# Patient Record
Sex: Female | Born: 1980 | Race: White | Hispanic: No | Marital: Married | Smoking: Never smoker
Health system: Southern US, Community
[De-identification: ages and names within clinical notes are randomized; demographics above are authoritative.]

## PROBLEM LIST (undated history)

## (undated) DIAGNOSIS — R87619 Unspecified abnormal cytological findings in specimens from cervix uteri: Secondary | ICD-10-CM

## (undated) DIAGNOSIS — N920 Excessive and frequent menstruation with regular cycle: Principal | ICD-10-CM

## (undated) DIAGNOSIS — Z87898 Personal history of other specified conditions: Secondary | ICD-10-CM

## (undated) DIAGNOSIS — D649 Anemia, unspecified: Secondary | ICD-10-CM

## (undated) DIAGNOSIS — IMO0002 Reserved for concepts with insufficient information to code with codable children: Secondary | ICD-10-CM

## (undated) DIAGNOSIS — D219 Benign neoplasm of connective and other soft tissue, unspecified: Secondary | ICD-10-CM

## (undated) DIAGNOSIS — R87629 Unspecified abnormal cytological findings in specimens from vagina: Secondary | ICD-10-CM

## (undated) DIAGNOSIS — N83209 Unspecified ovarian cyst, unspecified side: Secondary | ICD-10-CM

## (undated) DIAGNOSIS — Z9289 Personal history of other medical treatment: Secondary | ICD-10-CM

## (undated) HISTORY — DX: Unspecified abnormal cytological findings in specimens from cervix uteri: R87.619

## (undated) HISTORY — DX: Reserved for concepts with insufficient information to code with codable children: IMO0002

## (undated) HISTORY — PX: COLPOSCOPY W/ BIOPSY / CURETTAGE: SUR283

## (undated) HISTORY — DX: Excessive and frequent menstruation with regular cycle: N92.0

## (undated) HISTORY — DX: Unspecified ovarian cyst, unspecified side: N83.209

## (undated) HISTORY — DX: Unspecified abnormal cytological findings in specimens from vagina: R87.629

## (undated) HISTORY — DX: Benign neoplasm of connective and other soft tissue, unspecified: D21.9

## (undated) HISTORY — PX: LEEP: SHX91

## (undated) HISTORY — PX: WISDOM TOOTH EXTRACTION: SHX21

## (undated) HISTORY — PX: MYOMECTOMY: SHX85

---

## 2012-07-06 ENCOUNTER — Encounter: Payer: Self-pay | Admitting: Obstetrics and Gynecology

## 2012-07-06 ENCOUNTER — Encounter (INDEPENDENT_AMBULATORY_CARE_PROVIDER_SITE_OTHER): Payer: BC Managed Care – PPO | Admitting: Obstetrics and Gynecology

## 2012-07-06 VITALS — BP 138/90 | Ht 67.0 in | Wt 195.2 lb

## 2012-07-07 NOTE — Progress Notes (Signed)
This encounter was created in error - please disregard.

## 2012-07-09 ENCOUNTER — Ambulatory Visit (INDEPENDENT_AMBULATORY_CARE_PROVIDER_SITE_OTHER): Payer: BC Managed Care – PPO | Admitting: Adult Health

## 2012-07-09 ENCOUNTER — Encounter: Payer: Self-pay | Admitting: Adult Health

## 2012-07-09 VITALS — BP 134/80 | Ht 67.0 in | Wt 195.0 lb

## 2012-07-09 DIAGNOSIS — N920 Excessive and frequent menstruation with regular cycle: Secondary | ICD-10-CM

## 2012-07-09 DIAGNOSIS — Z87898 Personal history of other specified conditions: Secondary | ICD-10-CM

## 2012-07-09 DIAGNOSIS — Z8742 Personal history of other diseases of the female genital tract: Secondary | ICD-10-CM

## 2012-07-09 MED ORDER — NORGESTIMATE-ETH ESTRADIOL 0.25-35 MG-MCG PO TABS: 1.0000 | ORAL_TABLET | Freq: Every day | ORAL | Status: DC

## 2012-07-09 NOTE — Patient Instructions (Addendum)
Start sprintec with next menses Follow up in 2 months Sign up for my chart

## 2012-07-09 NOTE — Progress Notes (Signed)
Subjective:     Patient ID: Courtney Andersen, female   DOB: Jul 18, 1980, 32 y.o.   MRN: [id number]  HPI Courtney Andersen is a 32 year old white female who takes [place]ild[place] 1/20 and is complaining of bleeding x 9 days  and having clots. Has long term boyfriend. She is going to the gym and will be decreasing her carbs.  Review of Systems Patient denies any headaches, blurred vision, shortness of breath, chest pain, abdominal pain, problems with bowel movements, urination, or intercourse.  Positives as in HPI   Reviewed past medical,surgical, social and family history. Reviewed medications and allergies.  Objective:   Physical Exam Blood pressure 134/80, height 5\' 7"  (1.702 m), weight 195 lb (88.451 kg), last menstrual period 06/17/2012. Skin warm and dry.Pelvic: external genitalia is normal in appearance, vagina: is normal in appearance, cervix: bulbous, sp leep, uterus: normal size, shape and contour, non tender, no masses felt, adnexa: no masses or tenderness noted.   Assessment:     Menorrhagia    Plan:      Start Sprintec with next menses Follow up in 2 months  Take BP at home to keep check in it

## 2012-07-21 ENCOUNTER — Ambulatory Visit: Payer: BC Managed Care – PPO | Admitting: Obstetrics & Gynecology

## 2012-09-08 ENCOUNTER — Ambulatory Visit: Payer: BC Managed Care – PPO | Admitting: Adult Health

## 2012-10-29 ENCOUNTER — Encounter: Payer: Self-pay | Admitting: Adult Health

## 2012-10-29 ENCOUNTER — Ambulatory Visit (INDEPENDENT_AMBULATORY_CARE_PROVIDER_SITE_OTHER): Payer: BC Managed Care – PPO | Admitting: Adult Health

## 2012-10-29 ENCOUNTER — Other Ambulatory Visit (HOSPITAL_COMMUNITY)
Admission: RE | Admit: 2012-10-29 | Discharge: 2012-10-29 | Disposition: A | Discharge status: Home / Self Care | Payer: BC Managed Care – PPO | Source: Ambulatory Visit | Attending: Adult Health | Admitting: Adult Health

## 2012-10-29 VITALS — BP 152/80 | HR 72 | Ht 67.0 in | Wt 195.0 lb

## 2012-10-29 DIAGNOSIS — Z87898 Personal history of other specified conditions: Secondary | ICD-10-CM

## 2012-10-29 DIAGNOSIS — Z01419 Encounter for gynecological examination (general) (routine) without abnormal findings: Secondary | ICD-10-CM | POA: Insufficient documentation

## 2012-10-29 DIAGNOSIS — Z1151 Encounter for screening for human papillomavirus (HPV): Secondary | ICD-10-CM | POA: Insufficient documentation

## 2012-10-29 DIAGNOSIS — Z309 Encounter for contraceptive management, unspecified: Secondary | ICD-10-CM

## 2012-10-29 NOTE — Progress Notes (Signed)
Patient ID: Courtney Andersen, female   DOB: [birth year], 32 y.o.   MRN: [id number] History of Present Illness: Courtney Andersen is a 32 year old white female in for pap and physical.Her periods are not as long but still having clots, golf ball size, no pain.   Current Medications, Allergies, Past Medical History, Past Surgical History, Family History and Social History were reviewed in [employer] record.     Review of Systems: Patient denies any headaches, blurred vision, shortness of breath, chest pain, abdominal pain, problems with bowel movements, urination, or intercourse. No joint pain or mood changes, positives in HPI    Physical Exam:BP 152/80  Pulse 72  Ht 5\' 7"  (1.702 m)  Wt 195 lb (88.451 kg)  BMI 30.53 kg/m2  LMP 10/08/2012 General:  Well developed, well nourished, no acute distress Skin:  Warm and dry Neck:  Midline trachea, normal thyroid Lungs; Clear to auscultation bilaterally Breast:  No dominant palpable mass, retraction, or nipple discharge Cardiovascular: Regular rate and rhythm Abdomen:  Soft, non tender, no hepatosplenomegaly Pelvic:  External genitalia is normal in appearance.  The vagina is normal in appearance. The cervix is nulliparous and sp leep.pap with HPV performed.  Uterus is felt to be normal size, shape, and contour.  No  adnexal masses or tenderness not. Extremities:  No swelling or varicosities noted Psych:  Alert and cooperative,seems happy   Impression: Yearly gyn exam History of abnormal pap Contraceptive management     Plan: Physical and pap in 1 year Continue sprintec Try advil before period to see if decreases clots, if not will get [place]

## 2012-10-29 NOTE — Patient Instructions (Addendum)
Physical in 1 year Try advil before period

## 2012-11-20 ENCOUNTER — Telehealth: Payer: Self-pay | Admitting: Adult Health

## 2012-11-20 DIAGNOSIS — N938 Other specified abnormal uterine and vaginal bleeding: Secondary | ICD-10-CM

## 2012-11-20 NOTE — Telephone Encounter (Signed)
No answer will all back

## 2012-11-20 NOTE — Telephone Encounter (Signed)
Pt continues to have heavy periods with clots after taking the advil prior to period. Pt states is there anything Victorino Dike recommends for her to try other than the advil

## 2012-11-23 ENCOUNTER — Telehealth: Payer: Self-pay | Admitting: Adult Health

## 2012-11-23 NOTE — Telephone Encounter (Signed)
Pt has u/s scheduled per Cyril Mourning, NP

## 2012-11-23 NOTE — Addendum Note (Signed)
Addended by: Cyril Mourning A on: 11/23/2012 11:37 AM   Modules accepted: Orders

## 2012-11-23 NOTE — Telephone Encounter (Signed)
Bleeding x 16 days on sprintec will get [place] and labs Friday and see Monday pm

## 2012-11-24 ENCOUNTER — Ambulatory Visit (INDEPENDENT_AMBULATORY_CARE_PROVIDER_SITE_OTHER): Payer: BC Managed Care – PPO | Admitting: Adult Health

## 2012-11-24 ENCOUNTER — Encounter: Payer: Self-pay | Admitting: Adult Health

## 2012-11-24 ENCOUNTER — Other Ambulatory Visit: Payer: Self-pay | Admitting: Adult Health

## 2012-11-24 ENCOUNTER — Ambulatory Visit (INDEPENDENT_AMBULATORY_CARE_PROVIDER_SITE_OTHER): Payer: BC Managed Care – PPO

## 2012-11-24 VITALS — BP 120/80 | Ht 67.0 in | Wt 194.0 lb

## 2012-11-24 DIAGNOSIS — N949 Unspecified condition associated with female genital organs and menstrual cycle: Secondary | ICD-10-CM

## 2012-11-24 DIAGNOSIS — N83209 Unspecified ovarian cyst, unspecified side: Secondary | ICD-10-CM

## 2012-11-24 DIAGNOSIS — D219 Benign neoplasm of connective and other soft tissue, unspecified: Secondary | ICD-10-CM

## 2012-11-24 DIAGNOSIS — N938 Other specified abnormal uterine and vaginal bleeding: Secondary | ICD-10-CM

## 2012-11-24 DIAGNOSIS — D259 Leiomyoma of uterus, unspecified: Secondary | ICD-10-CM

## 2012-11-24 DIAGNOSIS — N920 Excessive and frequent menstruation with regular cycle: Secondary | ICD-10-CM

## 2012-11-24 HISTORY — DX: Unspecified ovarian cyst, unspecified side: N83.209

## 2012-11-24 HISTORY — DX: Excessive and frequent menstruation with regular cycle: N92.0

## 2012-11-24 HISTORY — DX: Benign neoplasm of connective and other soft tissue, unspecified: D21.9

## 2012-11-24 LAB — COMPREHENSIVE METABOLIC PANEL
ALT: 12 U/L (ref 0–35)
Albumin: 3.4 g/dL — ABNORMAL LOW (ref 3.5–5.2)
CO2: 26 mEq/L (ref 19–32)
Calcium: 8.7 mg/dL (ref 8.4–10.5)
Chloride: 102 mEq/L (ref 96–112)
Glucose, Bld: 85 mg/dL (ref 70–99)
Sodium: 135 mEq/L (ref 135–145)
Total Protein: 5.7 g/dL — ABNORMAL LOW (ref 6.0–8.3)

## 2012-11-24 LAB — CBC
Hemoglobin: 10.1 g/dL — ABNORMAL LOW (ref 12.0–15.0)
Platelets: 287 10*3/uL (ref 150–400)
RBC: 3.34 MIL/uL — ABNORMAL LOW (ref 3.87–5.11)
WBC: 9.8 10*3/uL (ref 4.0–10.5)

## 2012-11-24 LAB — TSH: TSH: 1.001 u[IU]/mL (ref 0.350–4.500)

## 2012-11-24 MED ORDER — DESOGESTREL-ETHINYL ESTRADIOL 0.15-0.02/0.01 MG (21/5) PO TABS: 1.0000 | ORAL_TABLET | Freq: Every day | ORAL | Status: DC

## 2012-11-24 NOTE — Patient Instructions (Addendum)
Uterine Fibroid A uterine fibroid is a growth (tumor) that occurs in a woman's uterus. This type of tumor is not cancerous and does not spread out of the uterus. A woman can have one or many fibroids, and the fiboid(s) can become quite large. A fibroid can vary in size, weight, and where it grows in the uterus. Most fibroids do not require medical treatment, but some can cause pain or heavy bleeding during and between periods. CAUSES  A fibroid is the result of a single uterine cell that keeps growing (unregulated), which is different than most cells in the human body. Most cells have a control mechanism that keeps them from reproducing without control.  SYMPTOMS  Bleeding. Pelvic pain and pressure. Bladder problems due to the size of the fibroid. Infertility and miscarriages depending on the size and location of the fibroid. DIAGNOSIS  A diagnosis is made by physical exam. Your caregiver may feel the lumpy tumors during a pelvic exam. Important information regarding size, location, and number of tumors can be gained by having an ultrasound. It is rare that other tests, such as a CT scan or MRI, are needed. TREATMENT  Your caregiver may recommend watchful waiting. This involves getting the fibroid checked by your caregiver to see if the fibroids grow or shrink.  Hormonal treatment or an intrauterine device (IUD) may be prescribed.  Surgery may be needed to remove the fibroids (myomectomy) or the uterus (hysterectomy). This depends on your situation. When fibroids interfere with fertility and a woman wants to become pregnant, a caregiver may recommend having the fibroids removed.  HOME CARE INSTRUCTIONS  Home care depends on how you were treated. In general:  Keep all follow-up appointments with your caregiver.  Only take medicine as told by your caregiver. Do not take aspirin. It can cause bleeding.  If you have excessive periods and soak tampons or pads in a half hour or less, contact your  caregiver immediately. If your periods are troublesome but not so heavy, lie down with your feet raised slightly above your heart. Place cold packs on your lower abdomen.  If your periods are heavy, write down the number of pads or tampons you use per month. Bring this information to your caregiver.  Talk to your caregiver about taking iron pills.  Include green vegetables in your diet.  If you were prescribed a hormonal treatment, take the hormonal medicines as directed.  If you need surgery, ask your caregiver for information on your specific surgery.  SEEK IMMEDIATE MEDICAL CARE IF: You have pelvic pain or cramps not controlled with medicines.  You have a sudden increase in pelvic pain.  You have an increase of bleeding between and during periods.  You feel lightheaded or have fainting episodes.  MAKE SURE YOU: Understand these instructions. Will watch your condition. Will get help right away if you are not doing well or get worse. Document Released: 02/16/2000 Document Revised: 05/13/2011 Document Reviewed: 03/11/2011 [facility] Patient Information 2014 [place], [place]. Menorrhagia Dysfunctional uterine bleeding is different from a normal menstrual period. When periods are heavy or there is more bleeding than is usual for you, it is called menorrhagia. It may be caused by hormonal imbalance, or physical, metabolic, or other problems. Examination is necessary in order that your caregiver may treat treatable causes. If this is a continuing problem, a D&C may be needed. That means that the cervix (the opening of the uterus or womb) is dilated (stretched larger) and the lining of the uterus is  scraped out. The tissue scraped out is then examined under a microscope by a specialist (pathologist) to make sure there is nothing of concern that needs further or more extensive treatment. HOME CARE INSTRUCTIONS   If medications were prescribed, take exactly as directed. Do not change or switch  medications without consulting your caregiver.  Long term heavy bleeding may result in iron deficiency. Your caregiver may have prescribed iron pills. They help replace the iron your body lost from heavy bleeding. Take exactly as directed. Iron may cause constipation. If this becomes a problem, increase the bran, fruits, and roughage in your diet.  Do not take aspirin or medicines that contain aspirin one week before or during your menstrual period. Aspirin may make the bleeding worse.  If you need to change your sanitary pad or tampon more than once every 2 hours, stay in bed and rest as much as possible until the bleeding stops.  Eat well-balanced meals. Eat foods high in iron. Examples are leafy green vegetables, meat, liver, eggs, and whole grain breads and cereals. Do not try to lose weight until the abnormal bleeding has stopped and your blood iron level is back to normal. SEEK MEDICAL CARE IF:   You need to change your sanitary pad or tampon more than once an hour.  You develop nausea (feeling sick to your stomach) and vomiting, dizziness, or diarrhea while you are taking your medicine.  You have any problems that may be related to the medicine you are taking. SEEK IMMEDIATE MEDICAL CARE IF:   You have a fever.  You develop chills.  You develop severe bleeding or start to pass blood clots.  You feel dizzy or faint. MAKE SURE YOU:   Understand these instructions.  Will watch your condition.  Will get help right away if you are not doing well or get worse. Document Released: 02/18/2005 Document Revised: 05/13/2011 Document Reviewed: 10/09/2007 [facility] Patient Information 2014 [place], [place]. Stop sprintec and start [employer]  Sunday use condoms Follow up in 4 weeks

## 2012-11-24 NOTE — Progress Notes (Signed)
Subjective:     Patient ID: Courtney Andersen, female   DOB: August 23, 1980, 32 y.o.   MRN: [id number]  HPI Courtney Andersen is a 32 year old white female in complaining of bleeding for about 16 days on sprintec, having [place] to evaluate.  Review of Systems See hPI Reviewed past medical,surgical, social and family history. Reviewed medications and allergies.     Objective:   Physical Exam BP 120/80  Ht 5\' 7"  (1.702 m)  Wt 194 lb (87.998 kg)  BMI 30.38 kg/m2  LMP 11/06/2012   reviewed [place] with pt, has 2 fibroids the largest is 4 cm and has right ovarian cyst 1 complex and 1 simple, stop sprintec today and start karvia Sunday.discussed [place] with Dr Despina Hidden.  Assessment:     Menorrhagia Fibroids Right ovarian cyst     Plan:     Check CBC,CMP,TSH Stop sprintec today and start karvia Sunday use condoms Follow up in 4 weeks prn problems

## 2012-11-25 ENCOUNTER — Telehealth: Payer: Self-pay | Admitting: Adult Health

## 2012-11-25 NOTE — Telephone Encounter (Signed)
Pt aware of labs and need to take iron bid

## 2012-11-27 ENCOUNTER — Other Ambulatory Visit: Payer: BC Managed Care – PPO

## 2012-11-30 ENCOUNTER — Ambulatory Visit: Payer: BC Managed Care – PPO | Admitting: Adult Health

## 2012-12-22 ENCOUNTER — Ambulatory Visit: Payer: BC Managed Care – PPO | Admitting: Adult Health

## 2012-12-25 ENCOUNTER — Ambulatory Visit: Payer: BC Managed Care – PPO | Admitting: Adult Health

## 2013-01-07 ENCOUNTER — Other Ambulatory Visit: Payer: Self-pay

## 2013-11-01 ENCOUNTER — Ambulatory Visit (INDEPENDENT_AMBULATORY_CARE_PROVIDER_SITE_OTHER): Payer: PRIVATE HEALTH INSURANCE | Admitting: Women's Health

## 2013-11-01 ENCOUNTER — Encounter: Payer: Self-pay | Admitting: Women's Health

## 2013-11-01 VITALS — BP 136/82 | Ht 67.0 in | Wt 198.0 lb

## 2013-11-01 DIAGNOSIS — Z01419 Encounter for gynecological examination (general) (routine) without abnormal findings: Secondary | ICD-10-CM

## 2013-11-01 DIAGNOSIS — N920 Excessive and frequent menstruation with regular cycle: Secondary | ICD-10-CM

## 2013-11-01 LAB — POCT HEMOGLOBIN: Hemoglobin: 11.1 g/dL — AB (ref 12.2–16.2)

## 2013-11-01 MED ORDER — DESOGESTREL-ETHINYL ESTRADIOL 0.15-0.02/0.01 MG (21/5) PO TABS: 1.0000 | ORAL_TABLET | Freq: Every day | ORAL | Status: DC

## 2013-11-01 NOTE — Patient Instructions (Signed)
Menorrhagia Menorrhagia is the medical term for when your menstrual periods are heavy or last longer than usual. With menorrhagia, every period you have may cause enough blood loss and cramping that you are unable to maintain your usual activities. CAUSES  In some cases, the cause of heavy periods is unknown, but a number of conditions may cause menorrhagia. Common causes include:  A problem with the hormone-producing thyroid gland (hypothyroid).  Noncancerous growths in the uterus (polyps or fibroids).  An imbalance of the estrogen and progesterone hormones.  One of your ovaries not releasing an egg during one or more months.  Side effects of having an intrauterine device (IUD).  Side effects of some medicines, such as anti-inflammatory medicines or blood thinners.  A bleeding disorder that stops your blood from clotting normally. SIGNS AND SYMPTOMS  During a normal period, bleeding lasts between 4 and 8 days. Signs that your periods are too heavy include:  You routinely have to change your pad or tampon every 1 or 2 hours because it is completely soaked.  You pass blood clots larger than 1 inch (2.5 cm) in size.  You have bleeding for more than 7 days.  You need to use pads and tampons at the same time because of heavy bleeding.  You need to wake up to change your pads or tampons during the night.  You have symptoms of anemia, such as tiredness, fatigue, or shortness of breath. DIAGNOSIS  Your health care provider will perform a physical exam and ask you questions about your symptoms and menstrual history. Other tests may be ordered based on what the health care provider finds during the exam. These tests can include:  Blood tests. Blood tests are used to check if you are pregnant or have hormonal changes, a bleeding or thyroid disorder, low iron levels (anemia), or other problems.  Endometrial biopsy. Your health care provider takes a sample of tissue from the inside of your  uterus to be examined under a microscope.  Pelvic ultrasound. This test uses sound waves to make a picture of your uterus, ovaries, and vagina. The pictures can show if you have fibroids or other growths.  Hysteroscopy. For this test, your health care provider will use a small telescope to look inside your uterus. Based on the results of your initial tests, your health care provider may recommend further testing. TREATMENT  Treatment may not be needed. If it is needed, your health care provider may recommend treatment with one or more medicines first. If these do not reduce bleeding enough, a surgical treatment might be an option. The best treatment for you will depend on:   Whether you need to prevent pregnancy.  Your desire to have children in the future.  The cause and severity of your bleeding.  Your opinion and personal preference.  Medicines for menorrhagia may include:  Birth control methods that use hormones. These include the pill, skin patch, vaginal ring, shots that you get every 3 months, hormonal IUD, and implant. These treatments reduce bleeding during your menstrual period.  Medicines that thicken blood and slow bleeding.  Medicines that reduce swelling, such as ibuprofen.  Medicines that contain a synthetic hormone called progestin.   Medicines that make the ovaries stop working for a short time.  You may need surgical treatment for menorrhagia if the medicines are unsuccessful. Treatment options include:  Dilation and curettage (D&C). In this procedure, your health care provider opens (dilates) your cervix and then scrapes or suctions tissue from   the lining of your uterus to reduce menstrual bleeding.  Operative hysteroscopy. This procedure uses a tiny tube with a light (hysteroscope) to view your uterine cavity and can help in the surgical removal of a polyp that may be causing heavy periods.  Endometrial ablation. Through various techniques, your health care  provider permanently destroys the entire lining of your uterus (endometrium). After endometrial ablation, most women have little or no menstrual flow. Endometrial ablation reduces your ability to become pregnant.  Endometrial resection. This surgical procedure uses an electrosurgical wire loop to remove the lining of the uterus. This procedure also reduces your ability to become pregnant.  Hysterectomy. Surgical removal of the uterus and cervix is a permanent procedure that stops menstrual periods. Pregnancy is not possible after a hysterectomy. This procedure requires anesthesia and hospitalization. HOME CARE INSTRUCTIONS   Only take over-the-counter or prescription medicines as directed by your health care provider. Take prescribed medicines exactly as directed. Do not change or switch medicines without consulting your health care provider.  Take any prescribed iron pills exactly as directed by your health care provider. Long-term heavy bleeding may result in low iron levels. Iron pills help replace the iron your body lost from heavy bleeding. Iron may cause constipation. If this becomes a problem, increase the bran, fruits, and roughage in your diet.  Do not take aspirin or medicines that contain aspirin 1 week before or during your menstrual period. Aspirin may make the bleeding worse.  If you need to change your sanitary pad or tampon more than once every 2 hours, stay in bed and rest as much as possible until the bleeding stops.  Eat well-balanced meals. Eat foods high in iron. Examples are leafy green vegetables, meat, liver, eggs, and whole grain breads and cereals. Do not try to lose weight until the abnormal bleeding has stopped and your blood iron level is back to normal. SEEK MEDICAL CARE IF:   You soak through a pad or tampon every 1 or 2 hours, and this happens every time you have a period.  You need to use pads and tampons at the same time because you are bleeding so much.  You  need to change your pad or tampon during the night.  You have a period that lasts for more than 8 days.  You pass clots bigger than 1 inch wide.  You have irregular periods that happen more or less often than once a month.  You feel dizzy or faint.  You feel very weak or tired.  You feel short of breath or feel your heart is beating too fast when you exercise.  You have nausea and vomiting or diarrhea while you are taking your medicine.  You have any problems that may be related to the medicine you are taking. SEEK IMMEDIATE MEDICAL CARE IF:   You soak through 4 or more pads or tampons in 2 hours.  You have any bleeding while you are pregnant. MAKE SURE YOU:   Understand these instructions.  Will watch your condition.  Will get help right away if you are not doing well or get worse. Document Released: 02/18/2005 Document Revised: 02/23/2013 Document Reviewed: 08/09/2012 [facility] Patient Information 2015 [place], [place]. This information is not intended to replace advice given to you by your health care provider. Make sure you discuss any questions you have with your health care provider.  Uterine Fibroid A uterine fibroid is a growth (tumor) that occurs in your uterus. This type of tumor is not  cancerous and does not spread out of the uterus. You can have one or many fibroids. Fibroids can vary in size, weight, and where they grow in the uterus. Some can become quite large. Most fibroids do not require medical treatment, but some can cause pain or heavy bleeding during and between periods. CAUSES  A fibroid is the result of a single uterine cell that keeps growing (unregulated), which is different than most cells in the human body. Most cells have a control mechanism that keeps them from reproducing without control.  SIGNS AND SYMPTOMS   Bleeding.  Pelvic pain and pressure.  Bladder problems due to the size of the fibroid.  Infertility and miscarriages depending on the  size and location of the fibroid. DIAGNOSIS  Uterine fibroids are diagnosed through a physical exam. Your health care provider may feel the lumpy tumors during a pelvic exam. Ultrasonography may be done to get information regarding size, location, and number of tumors.  TREATMENT   Your health care provider may recommend watchful waiting. This involves getting the fibroid checked by your health care provider to see if it grows or shrinks.   Hormone treatment or an intrauterine device (IUD) may be prescribed.   Surgery may be needed to remove the fibroids (myomectomy) or the uterus (hysterectomy). This depends on your situation. When fibroids interfere with fertility and a woman wants to become pregnant, a health care provider may recommend having the fibroids removed.  [place] care depends on how you were treated. In general:   Keep all follow-up appointments with your health care provider.   Only take over-the-counter or prescription medicines as directed by your health care provider. If you were prescribed a hormone treatment, take the hormone medicines exactly as directed. Do not take aspirin. It can cause bleeding.   Talk to your health care provider about taking iron pills.  If your periods are troublesome but not so heavy, lie down with your feet raised slightly above your heart. Place cold packs on your lower abdomen.   If your periods are heavy, write down the number of pads or tampons you use per month. Bring this information to your health care provider.   Include green vegetables in your diet.  SEEK IMMEDIATE MEDICAL CARE IF:  You have pelvic pain or cramps not controlled with medicines.   You have a sudden increase in pelvic pain.   You have an increase in bleeding between and during periods.   You have excessive periods and soak tampons or pads in a half hour or less.  You feel lightheaded or have fainting episodes. Document Released:  02/16/2000 Document Revised: 12/09/2012 Document Reviewed: 09/17/2012 [facility] Patient Information 2015 [place], [place]. This information is not intended to replace advice given to you by your health care provider. Make sure you discuss any questions you have with your health care provider.

## 2013-11-01 NOTE — Addendum Note (Signed)
Addended by: Traci Sermon A on: 11/01/2013 10:08 AM   Modules accepted: Orders

## 2013-11-01 NOTE — Progress Notes (Signed)
Patient ID: Courtney Andersen, female   DOB: 05/26/1980, 33 y.o.   MRN: [id number] Subjective:   Courtney Andersen is a 33 y.o. G45 Caucasian female here for a routine well-woman exam.  Patient's last menstrual period was 10/20/2013.    Current complaints: heavy periods w/ large clots x 1 yr. Periods are regular on COCs, but last 1.5-2wks w/ many clots slightly smaller than her fist, wears pads- on heavy days has to change q 1hr. 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 States Hgb was 7 or 8 when checked 3 months ago- has not been taking Fe d/t it causing ha's and constipation.  Has known fibroids, last u/s 11/24/12: 2 fibroids, 4cm & 2.1cm and 2 Rt ovarian cysts.  Does desire pregnancy in future, maybe in a few years as she is starting grad school now.  PCP: Dr. Anastasio Champion       Does not desire labs, checked by PCP 3 months ago- all normal except high triglycerides- working on losing weight  Social History: Sexual: heterosexual Marital Status: dating Living situation: with boyfriend Occupation: community support w/ [employer] Tobacco/alcohol: no tobacco, q other wkend Illicit drugs: no history of illicit drug use  The following portions of the patient's history were reviewed and updated as appropriate: allergies, current medications, past family history, past medical history, past social history, past surgical history and problem list.  Past Medical History Past Medical History  Diagnosis Date  . Abnormal Pap smear   . Menorrhagia 11/24/2012  . Fibroids 11/24/2012  . Other and unspecified ovarian cyst 11/24/2012    Past Surgical History Past Surgical History  Procedure Laterality Date  . Colposcopy w/ biopsy / curettage    . Leep      Gynecologic History No obstetric history on file.  Patient's last menstrual period was 10/20/2013. Contraception: COCs Last Pap: 2014. Results were: normal w/ neg HPV Last mammogram: never. Results were: n/a Last TCS: never  Obstetric History OB  History  No data available    Current Medications Current Outpatient Prescriptions on File Prior to Visit  Medication Sig Dispense Refill  . desogestrel-ethinyl estradiol (KARIVA,AZURETTE,MIRCETTE) 0.15-0.02/0.01 MG (21/5) tablet Take 1 tablet by mouth daily.  1 Package  11   No current facility-administered medications on file prior to visit.    Review of Systems Patient denies any headaches, blurred vision, shortness of breath, chest pain, abdominal pain, problems with bowel movements, urination, or intercourse.  Objective:  BP 136/82  Ht 5\' 7"  (1.702 m)  Wt 198 lb (89.812 kg)  BMI 31.00 kg/m2  LMP 10/20/2013  Hgb fingerstick: 11 today  Physical Exam  General:  Well developed, well nourished, no acute distress. She is alert and oriented x3. Skin:  Warm and dry Neck:  Midline trachea, no thyromegaly or nodules Cardiovascular: Regular rate and rhythm, no murmur heard Lungs:  Effort normal, all lung fields clear to auscultation bilaterally Breasts:  No dominant palpable mass, retraction, or nipple discharge Abdomen:  Soft, non tender, no hepatosplenomegaly or masses Pelvic:  External genitalia is normal in appearance.  The vagina is normal in appearance. The cervix is bulbous, no CMT, multiple endocervical lobules- co-exam w/ JAG- from prior LEEP.  Thin prep pap is not doneUterus is felt to be enlarged.  No adnexal masses or tenderness noted. Extremities:  No swelling or varicosities noted Psych:  She has a normal mood and affect  Assessment:   Healthy well-woman exam H/O abnormal pap w/ LEEP 90yrs ago, last years pap  normal w/ neg HPV Menorrhagia Known fibroids and Rt ovarian cysts  Plan:  Refill COCs for now Iron rich diet F/U 1wk for pelvic u/s to assess fibroids/ovarian cysts, then f/u w/ me, or sooner if needed Mammogram @33yo  or sooner if problems Colonoscopy @33yo  or sooner if problems  Tawnya Crook CNM, WHNP-BC 11/01/2013 9:10 AM

## 2013-11-01 NOTE — Addendum Note (Signed)
Addended by: Traci Sermon A on: 11/01/2013 10:49 AM   Modules accepted: Orders

## 2013-11-02 ENCOUNTER — Other Ambulatory Visit: Payer: PRIVATE HEALTH INSURANCE

## 2013-11-02 ENCOUNTER — Ambulatory Visit: Payer: PRIVATE HEALTH INSURANCE | Admitting: Women's Health

## 2013-11-03 ENCOUNTER — Ambulatory Visit: Payer: PRIVATE HEALTH INSURANCE | Admitting: Women's Health

## 2013-11-09 ENCOUNTER — Ambulatory Visit (INDEPENDENT_AMBULATORY_CARE_PROVIDER_SITE_OTHER): Payer: PRIVATE HEALTH INSURANCE

## 2013-11-09 ENCOUNTER — Ambulatory Visit (INDEPENDENT_AMBULATORY_CARE_PROVIDER_SITE_OTHER): Payer: PRIVATE HEALTH INSURANCE | Admitting: Women's Health

## 2013-11-09 ENCOUNTER — Encounter: Payer: Self-pay | Admitting: Women's Health

## 2013-11-09 ENCOUNTER — Other Ambulatory Visit: Payer: PRIVATE HEALTH INSURANCE

## 2013-11-09 VITALS — BP 130/68 | Ht 67.0 in | Wt 202.0 lb

## 2013-11-09 DIAGNOSIS — N920 Excessive and frequent menstruation with regular cycle: Secondary | ICD-10-CM

## 2013-11-09 MED ORDER — NORGESTIMATE-ETH ESTRADIOL 0.25-35 MG-MCG PO TABS: 1.0000 | ORAL_TABLET | Freq: Every day | ORAL | Status: DC

## 2013-11-09 NOTE — Progress Notes (Signed)
Patient ID: Courtney Andersen, female   DOB: [birth year], 33 y.o.   MRN: [id number]   [facility] Visit  Patient name: Courtney Andersen MRN [id number]  Date of birth: 14-Jul-1980  CC & HPI:  [name] is a 33 y.o. G36 Caucasian female presenting today for f/u after pelvic u/s to assess fibroids, menorrhagia. Fibroids have only increased slightly from 4 & 2.1cm to 4.5 & 2.3cm, w/ small simple cyst on Lt ovary. Desires pregnancy w/in next 29yrs. Does not smoke, no h/o HTN, DVT/PE, CVA, MI, or migraines w/ aura.    Pertinent History Reviewed:  Medical & Surgical Hx:   Past Medical History  Diagnosis Date  . Abnormal Pap smear   . Menorrhagia 11/24/2012  . Fibroids 11/24/2012  . Other and unspecified ovarian cyst 11/24/2012   Past Surgical History  Procedure Laterality Date  . Colposcopy w/ biopsy / curettage    . Leep     Medications: Reviewed & Updated - see associated section Social History: Reviewed -  reports that she has been passively smoking.  She has never used smokeless tobacco.  Objective Findings:  Vitals: BP 130/68  Ht 5\' 7"  (1.702 m)  Wt 202 lb (91.627 kg)  BMI 31.63 kg/m2  LMP 10/20/2013  Physical Examination: General appearance - alert, well appearing, and in no distress  No results found for this or any previous visit (from the past 24 hour(s)).   Assessment & Plan:  A:   Menorrhagia  Stable uterine fibroids P:  Discussed w/ JAG, and then discussed options of increasing estrogen in COCs vs. Depo vs. Megace, pt opted for increasing estrogen in COCs  Stop Mircette, rx Sprintec w/ 11RF   F/U 67mths for f/u, call sooner if not helping  Tawnya Crook CNM, [facility] 11/09/2013 4:04 PM

## 2013-11-09 NOTE — Patient Instructions (Signed)

## 2013-11-17 ENCOUNTER — Encounter: Payer: Self-pay | Admitting: Women's Health

## 2014-02-08 ENCOUNTER — Ambulatory Visit: Payer: PRIVATE HEALTH INSURANCE | Admitting: Women's Health

## 2014-09-02 DIAGNOSIS — Z9289 Personal history of other medical treatment: Secondary | ICD-10-CM

## 2014-09-02 HISTORY — DX: Personal history of other medical treatment: Z92.89

## 2014-10-25 ENCOUNTER — Other Ambulatory Visit: Payer: Self-pay | Admitting: Women's Health

## 2014-11-03 ENCOUNTER — Other Ambulatory Visit (HOSPITAL_COMMUNITY)
Admission: RE | Admit: 2014-11-03 | Discharge: 2014-11-03 | Disposition: A | Discharge status: Home / Self Care | Payer: PRIVATE HEALTH INSURANCE | Source: Ambulatory Visit | Attending: Advanced Practice Midwife | Admitting: Advanced Practice Midwife

## 2014-11-03 ENCOUNTER — Encounter: Payer: Self-pay | Admitting: Advanced Practice Midwife

## 2014-11-03 ENCOUNTER — Ambulatory Visit (INDEPENDENT_AMBULATORY_CARE_PROVIDER_SITE_OTHER): Payer: PRIVATE HEALTH INSURANCE | Admitting: Advanced Practice Midwife

## 2014-11-03 VITALS — BP 130/80 | HR 80 | Ht 67.0 in | Wt 197.0 lb

## 2014-11-03 DIAGNOSIS — Z01419 Encounter for gynecological examination (general) (routine) without abnormal findings: Secondary | ICD-10-CM | POA: Insufficient documentation

## 2014-11-03 DIAGNOSIS — Z1151 Encounter for screening for human papillomavirus (HPV): Secondary | ICD-10-CM | POA: Insufficient documentation

## 2014-11-03 MED ORDER — NORGESTIMATE-ETH ESTRADIOL 0.25-35 MG-MCG PO TABS: 1.0000 | ORAL_TABLET | Freq: Every day | ORAL | Status: DC

## 2014-11-03 NOTE — Progress Notes (Signed)
Courtney Andersen 34 y.o.  Filed Vitals:   11/03/14 0834  BP: 130/80  Pulse: 80     Past Medical History: Past Medical History  Diagnosis Date  . Abnormal Pap smear   . Menorrhagia 11/24/2012  . Fibroids 11/24/2012  . Other and unspecified ovarian cyst 11/24/2012    Past Surgical History: Past Surgical History  Procedure Laterality Date  . Colposcopy w/ biopsy / curettage    . Leep      Family History: Family History  Problem Relation Age of Onset  . Hypertension Mother   . Diabetes Father   . Hypertension Father   . Diabetes Brother   . Cancer Paternal Aunt   . Cancer Paternal Grandmother     Social History: Social History  Substance Use Topics  . Smoking status: Passive Smoke Exposure - Never Smoker  . Smokeless tobacco: Never Used  . Alcohol Use: Yes     Comment: occ    Allergies: No Known Allergies    Current outpatient prescriptions:  Marland Kitchen  MONO-LINYAH 0.25-35 MG-MCG tablet, take 1 tablet by mouth once daily, Disp: 28 tablet, Rfl: 0  History of Present Illness: Here for well woman exam.  Had LEEP 2013, normal paps with neg HPV 2014.  Has known fibroids, last [place] 2015 (stable in size).  Last year estrogen in COC's was increased in an effort to help decrease flow. It didn't really work.  Bleeding may be less, but still has blood clots.  This last month period.was actually light and normal.   Gets labs checked with PCP--last check a year or so ago, triglycerides were a litlle high.Excercises (workout video) 4-5x/week.  Plans to try to get pregnant in the next year or so.  Same partner for several years.    Review of Systems   Patient denies any headaches, blurred vision, shortness of breath, chest pain, abdominal pain, problems with bowel movements, urination, or intercourse.   Physical Exam: General:  Well developed, well nourished, no acute distress Skin:  Warm and dry Neck:  Midline trachea, normal thyroid Lungs; Clear to auscultation bilaterally Breast:   No dominant palpable mass, retraction, or nipple discharge Cardiovascular: Regular rate and rhythm Abdomen:  Soft, non tender, no hepatosplenomegaly Pelvic:  External genitalia is normal in appearance.  The vagina is normal in appearance.  The cervix is bulbous.  Uterus is felt to be 6-8 week size.  No adnexal masses or tenderness noted.  Extremities:  No swelling or varicosities noted Psych:  No mood changes.     Impression: Normal GYN exam     Plan: If this pap is normal with negative HPV, may go to q 3 year paps/cotesting.   Start PNV 1 month before stopping COC's if tries to get pregnant

## 2014-11-03 NOTE — Addendum Note (Signed)
Addended by: Linton Rump on: 11/03/2014 09:15 AM   Modules accepted: Orders

## 2014-11-08 LAB — CYTOLOGY - PAP

## 2015-01-23 ENCOUNTER — Telehealth: Payer: Self-pay | Admitting: Advanced Practice Midwife

## 2015-01-23 NOTE — Telephone Encounter (Signed)
Pt c/o heavy vaginal bleeding x 5 days with clots, history of fibroids, c/o symptoms of dizziness and increased heart rate (112). Pt states bleeding has slowed down now and symptoms have improved but wants to know what she can do for future reference. Pt informed to take iron, eat foods increased in iron, call our office while having the heavy bleeding to be seen. Pt verbalized understanding.

## 2015-09-02 DIAGNOSIS — Z87898 Personal history of other specified conditions: Secondary | ICD-10-CM

## 2015-09-02 HISTORY — DX: Personal history of other specified conditions: Z87.898

## 2015-09-25 ENCOUNTER — Observation Stay (HOSPITAL_COMMUNITY)
Admission: EM | Admit: 2015-09-25 | Discharge: 2015-09-26 | Disposition: A | Discharge status: Home / Self Care | Payer: PRIVATE HEALTH INSURANCE | Attending: Obstetrics and Gynecology | Admitting: Obstetrics and Gynecology

## 2015-09-25 ENCOUNTER — Encounter (HOSPITAL_COMMUNITY): Payer: Self-pay | Admitting: Emergency Medicine

## 2015-09-25 ENCOUNTER — Observation Stay (HOSPITAL_COMMUNITY): Payer: PRIVATE HEALTH INSURANCE

## 2015-09-25 DIAGNOSIS — R42 Dizziness and giddiness: Secondary | ICD-10-CM | POA: Insufficient documentation

## 2015-09-25 DIAGNOSIS — R51 Headache: Secondary | ICD-10-CM | POA: Insufficient documentation

## 2015-09-25 DIAGNOSIS — N939 Abnormal uterine and vaginal bleeding, unspecified: Principal | ICD-10-CM

## 2015-09-25 DIAGNOSIS — D259 Leiomyoma of uterus, unspecified: Secondary | ICD-10-CM | POA: Diagnosis not present

## 2015-09-25 DIAGNOSIS — N920 Excessive and frequent menstruation with regular cycle: Secondary | ICD-10-CM | POA: Diagnosis present

## 2015-09-25 DIAGNOSIS — Z7722 Contact with and (suspected) exposure to environmental tobacco smoke (acute) (chronic): Secondary | ICD-10-CM | POA: Diagnosis not present

## 2015-09-25 DIAGNOSIS — D500 Iron deficiency anemia secondary to blood loss (chronic): Secondary | ICD-10-CM | POA: Diagnosis not present

## 2015-09-25 DIAGNOSIS — N938 Other specified abnormal uterine and vaginal bleeding: Secondary | ICD-10-CM | POA: Diagnosis not present

## 2015-09-25 DIAGNOSIS — D219 Benign neoplasm of connective and other soft tissue, unspecified: Secondary | ICD-10-CM | POA: Diagnosis present

## 2015-09-25 DIAGNOSIS — D649 Anemia, unspecified: Secondary | ICD-10-CM | POA: Diagnosis present

## 2015-09-25 HISTORY — DX: Anemia, unspecified: D64.9

## 2015-09-25 LAB — CBC WITH DIFFERENTIAL/PLATELET
BASOS ABS: 0 10*3/uL (ref 0.0–0.1)
BASOS PCT: 0 %
Eosinophils Absolute: 0 10*3/uL (ref 0.0–0.7)
Eosinophils Relative: 0 %
HEMATOCRIT: 13.8 % — AB (ref 36.0–46.0)
HEMOGLOBIN: 4.2 g/dL — AB (ref 12.0–15.0)
Lymphocytes Relative: 17 %
Lymphs Abs: 1.3 10*3/uL (ref 0.7–4.0)
MCH: 22.1 pg — ABNORMAL LOW (ref 26.0–34.0)
MCHC: 30.4 g/dL (ref 30.0–36.0)
MCV: 72.6 fL — ABNORMAL LOW (ref 78.0–100.0)
MONOS PCT: 3 %
Monocytes Absolute: 0.3 10*3/uL (ref 0.1–1.0)
NEUTROS ABS: 6 10*3/uL (ref 1.7–7.7)
NEUTROS PCT: 79 %
Platelets: 228 10*3/uL (ref 150–400)
RBC: 1.9 MIL/uL — AB (ref 3.87–5.11)
RDW: 18.7 % — ABNORMAL HIGH (ref 11.5–15.5)
WBC: 7.6 10*3/uL (ref 4.0–10.5)

## 2015-09-25 LAB — ABO/RH: ABO/RH(D): O NEG

## 2015-09-25 LAB — URINE MICROSCOPIC-ADD ON: WBC, UA: NONE SEEN WBC/hpf (ref 0–5)

## 2015-09-25 LAB — WET PREP, GENITAL
SPERM: NONE SEEN
TRICH WET PREP: NONE SEEN
WBC WET PREP: NONE SEEN
YEAST WET PREP: NONE SEEN

## 2015-09-25 LAB — BASIC METABOLIC PANEL
ANION GAP: 5 (ref 5–15)
BUN: 9 mg/dL (ref 6–20)
CHLORIDE: 108 mmol/L (ref 101–111)
CO2: 23 mmol/L (ref 22–32)
Calcium: 7.9 mg/dL — ABNORMAL LOW (ref 8.9–10.3)
Creatinine, Ser: 0.64 mg/dL (ref 0.44–1.00)
GFR calc non Af Amer: >60 mL/min (ref >60)
Glucose, Bld: 114 mg/dL — ABNORMAL HIGH (ref 65–99)
POTASSIUM: 3.6 mmol/L (ref 3.5–5.1)
Sodium: 136 mmol/L (ref 135–145)

## 2015-09-25 LAB — URINALYSIS, ROUTINE W REFLEX MICROSCOPIC
Bilirubin Urine: NEGATIVE
Glucose, UA: NEGATIVE mg/dL
KETONES UR: NEGATIVE mg/dL
LEUKOCYTES UA: NEGATIVE
NITRITE: NEGATIVE
PH: 6 (ref 5.0–8.0)
PROTEIN: NEGATIVE mg/dL
Specific Gravity, Urine: <1.005 — ABNORMAL LOW (ref 1.005–1.030)

## 2015-09-25 LAB — POC URINE PREG, ED: Preg Test, Ur: NEGATIVE

## 2015-09-25 LAB — PREPARE RBC (CROSSMATCH)

## 2015-09-25 MED ORDER — NORGESTIMATE-ETH ESTRADIOL 0.25-35 MG-MCG PO TABS: 1.0000 | ORAL_TABLET | Freq: Every day | ORAL | Status: DC

## 2015-09-25 MED ORDER — ACETAMINOPHEN 325 MG PO TABS
650.0000 mg | ORAL_TABLET | Freq: Once | ORAL | Status: AC
  Administered 2015-09-25: 650 mg via ORAL
  Filled 2015-09-25: qty 2

## 2015-09-25 MED ORDER — PRENATAL MULTIVITAMIN CH
1.0000 | ORAL_TABLET | Freq: Every day | ORAL | Status: DC
  Administered 2015-09-25: 1 via ORAL
  Filled 2015-09-25 (×3): qty 1

## 2015-09-25 MED ORDER — ONDANSETRON HCL 4 MG/2ML IJ SOLN
4.0000 mg | Freq: Once | INTRAMUSCULAR | Status: AC
  Administered 2015-09-25: 4 mg via INTRAVENOUS
  Filled 2015-09-25: qty 2

## 2015-09-25 MED ORDER — ACETAMINOPHEN 325 MG PO TABS
650.0000 mg | ORAL_TABLET | ORAL | Status: DC | PRN
  Administered 2015-09-25: 650 mg via ORAL
  Filled 2015-09-25: qty 2

## 2015-09-25 MED ORDER — SODIUM CHLORIDE 0.9 % IV SOLN
510.0000 mg | Freq: Once | INTRAVENOUS | Status: AC
  Administered 2015-09-25: 510 mg via INTRAVENOUS
  Filled 2015-09-25: qty 17

## 2015-09-25 MED ORDER — DOCUSATE SODIUM 100 MG PO CAPS
100.0000 mg | ORAL_CAPSULE | Freq: Two times a day (BID) | ORAL | Status: DC
  Administered 2015-09-25 (×2): 100 mg via ORAL
  Filled 2015-09-25 (×2): qty 1

## 2015-09-25 MED ORDER — SODIUM CHLORIDE 0.9 % IV SOLN
Freq: Once | INTRAVENOUS | Status: AC
  Administered 2015-09-25: 12:00:00 via INTRAVENOUS

## 2015-09-25 MED ORDER — SODIUM CHLORIDE 0.9 % IV BOLUS (SEPSIS): 1000.0000 mL | Freq: Once | INTRAVENOUS | Status: DC

## 2015-09-25 MED ORDER — MEGESTROL ACETATE 40 MG PO TABS
40.0000 mg | ORAL_TABLET | Freq: Three times a day (TID) | ORAL | Status: DC
  Administered 2015-09-25 (×2): 40 mg via ORAL
  Filled 2015-09-25 (×6): qty 1

## 2015-09-25 MED ORDER — SODIUM CHLORIDE 0.9 % IV SOLN
INTRAVENOUS | Status: DC
  Administered 2015-09-25: 14:00:00 via INTRAVENOUS

## 2015-09-25 NOTE — ED Notes (Signed)
Report given to Audrea Muscat on 300

## 2015-09-25 NOTE — ED Triage Notes (Signed)
Pt reports vaginal bleeding x3 weeks. Pt reports LMP June 24th. Pt reports intense bleeding began on tuhrsday. Pt reports has had to change pad 1 time within the last hour. Pt reports history of anemia and reports intermittent headaches since the  Vaginal bleeding started.

## 2015-09-25 NOTE — ED Notes (Signed)
Hgb-4.2. EDP and EDPA aware.

## 2015-09-25 NOTE — ED Provider Notes (Signed)
[facility] DEPT Provider Note   CSN: [id number] Arrival date & time: 09/25/15  0830  First Provider Contact:  First MD Initiated Contact with Patient 09/25/15 1015        History   Chief Complaint Chief Complaint  Patient presents with  . Vaginal Bleeding    HPI Courtney Andersen is a 35 y.o. female who complains of persistent vaginal bleeding for 3 weeks. She states that she is currently taking birth control pills but has continued to bleed. She reports heavier than usual periods with large blood clots.  She states that 2 days ago she "passed out" in the bathroom. She complains of generalized weakness, frontal headache, intermittent dizziness. She states that she is anemic and occasionally takes iron. She denies abdominal pain, pelvic pain, discharge, vomiting dysuria and fever.  HPI  Past Medical History:  Diagnosis Date  . Abnormal Pap smear   . Anemia   . Fibroids 11/24/2012  . Menorrhagia 11/24/2012  . Other and unspecified ovarian cyst 11/24/2012    Patient Active Problem List   Diagnosis Date Noted  . Menorrhagia 11/24/2012  . Fibroids 11/24/2012  . Other and unspecified ovarian cyst 11/24/2012  . History of abnormal Pap smear 07/09/2012    Past Surgical History:  Procedure Laterality Date  . COLPOSCOPY W/ BIOPSY / CURETTAGE    . LEEP      OB History    No data available       Home Medications    Prior to Admission medications   Medication Sig Start Date End Date Taking? Authorizing Provider  norgestimate-ethinyl estradiol (MONO-LINYAH) 0.25-35 MG-MCG tablet Take 1 tablet by mouth daily. 11/03/14  Yes Christin Fudge, CNM    Family History Family History  Problem Relation Age of Onset  . Hypertension Mother   . Diabetes Father   . Hypertension Father   . Diabetes Brother   . Cancer Paternal Grandmother   . Cancer Paternal Aunt     Social History Social History  Substance Use Topics  . Smoking status: Passive Smoke Exposure - Never  Smoker  . Smokeless tobacco: Never Used  . Alcohol use Yes     Comment: occ     Allergies   Review of patient's allergies indicates no known allergies.   Review of Systems Review of Systems  Constitutional: Negative for activity change, appetite change and fever.  Respiratory: Negative for chest tightness and shortness of breath.   Cardiovascular: Negative for chest pain.  Gastrointestinal: Negative for abdominal pain, nausea and vomiting.  Genitourinary: Positive for menstrual problem and vaginal bleeding. Negative for dysuria, pelvic pain and vaginal discharge.  Musculoskeletal: Negative for arthralgias and back pain.  Neurological: Positive for dizziness and headaches. Negative for weakness.     Physical Exam Updated Vital Signs BP 142/77 (BP Location: Right Arm)   Pulse 108   Temp 98.7 F (37.1 C) (Oral)   Resp 18   Ht 5\' 7"  (1.702 m)   Wt 80.7 kg   LMP 08/26/2015   SpO2 100%   BMI 27.88 kg/m   Physical Exam  Constitutional: She is oriented to person, place, and time. She appears well-developed and well-nourished. No distress.  HENT:  Head: Normocephalic and atraumatic.  Mouth/Throat: Oropharynx is clear and moist.  Cardiovascular: Normal rate and regular rhythm.   No murmur heard. Pulmonary/Chest: Effort normal and breath sounds normal. No respiratory distress.  Abdominal: Soft. Bowel sounds are normal. She exhibits no distension. There is no tenderness. There is no guarding.  Genitourinary: Uterus normal. Cervix exhibits no motion tenderness. Right adnexum displays no mass and no tenderness. Left adnexum displays no mass and no tenderness. There is bleeding in the vagina. No tenderness in the vagina.  Genitourinary Comments: Pelvic exam chaperoned by nursing staff. Small amount of blood in the vaginal vault. No cervical motion tenderness or adnexal masses or tenderness.  Musculoskeletal: Normal range of motion. She exhibits no edema.  Neurological: She is alert  and oriented to person, place, and time. She exhibits normal muscle tone. Coordination normal.  Skin: Skin is warm and dry.  Nursing note and vitals reviewed.    ED Treatments / Results  Labs (all labs ordered are listed, but only abnormal results are displayed) Labs Reviewed  WET PREP, GENITAL - Abnormal; Notable for the following:       Result Value   Clue Cells Wet Prep HPF POC PRESENT (*)    All other components within normal limits  URINALYSIS, ROUTINE W REFLEX MICROSCOPIC (NOT AT [facility]) - Abnormal; Notable for the following:    Specific Gravity, Urine <1.005 (*)    Hgb urine dipstick TRACE (*)    All other components within normal limits  CBC WITH DIFFERENTIAL/PLATELET - Abnormal; Notable for the following:    RBC 1.90 (*)    Hemoglobin 4.2 (*)    HCT 13.8 (*)    MCV 72.6 (*)    MCH 22.1 (*)    RDW 18.7 (*)    All other components within normal limits  BASIC METABOLIC PANEL - Abnormal; Notable for the following:    Glucose, Bld 114 (*)    Calcium 7.9 (*)    All other components within normal limits  URINE MICROSCOPIC-ADD ON - Abnormal; Notable for the following:    Squamous Epithelial / LPF 0-5 (*)    Bacteria, UA RARE (*)    All other components within normal limits  POC URINE PREG, ED  TYPE AND SCREEN  PREPARE RBC (CROSSMATCH)  GC/CHLAMYDIA PROBE AMP ([place]) NOT AT [facility]    EKG  EKG Interpretation None       Radiology No results found.  Procedures Procedures (including critical care time)  Medications Ordered in ED Medications  0.9 %  sodium chloride infusion (not administered)  sodium chloride 0.9 % bolus 1,000 mL (1,000 mLs Intravenous New Bag/Given 09/25/15 1105)  ondansetron (ZOFRAN) injection 4 mg (4 mg Intravenous Given 09/25/15 1105)  acetaminophen (TYLENOL) tablet 650 mg (650 mg Oral Given 09/25/15 1224)     Initial Impression / Assessment and Plan / ED Course  I have reviewed the triage vital signs and the nursing notes.  Pertinent  labs & imaging results that were available during my care of the patient were reviewed by me and considered in my medical decision making (see chart for details).  Clinical Course   Vital signs stable. Orthostatics obtained. Will order labs and administer IV fluids.  Hgb of 4.   1154  Consulted Dr. Glo Herring.  Will admit.    Final Clinical Impressions(s) / ED Diagnoses   Final diagnoses:  Abnormal uterine bleeding (AUB)    New Prescriptions New Prescriptions   No medications on file     Bufford Lope 09/25/15 [place], MD 09/25/15 1429

## 2015-09-26 DIAGNOSIS — N920 Excessive and frequent menstruation with regular cycle: Secondary | ICD-10-CM

## 2015-09-26 DIAGNOSIS — D500 Iron deficiency anemia secondary to blood loss (chronic): Secondary | ICD-10-CM | POA: Diagnosis not present

## 2015-09-26 DIAGNOSIS — N938 Other specified abnormal uterine and vaginal bleeding: Secondary | ICD-10-CM | POA: Diagnosis not present

## 2015-09-26 DIAGNOSIS — D259 Leiomyoma of uterus, unspecified: Secondary | ICD-10-CM | POA: Diagnosis not present

## 2015-09-26 DIAGNOSIS — N939 Abnormal uterine and vaginal bleeding, unspecified: Secondary | ICD-10-CM

## 2015-09-26 LAB — TYPE AND SCREEN
ABO/RH(D): O NEG
Antibody Screen: NEGATIVE
UNIT DIVISION: 0
Unit division: 0
Unit division: 0

## 2015-09-26 LAB — CBC
HEMATOCRIT: 23.2 % — AB (ref 36.0–46.0)
Hemoglobin: 7.4 g/dL — ABNORMAL LOW (ref 12.0–15.0)
MCH: 25.7 pg — ABNORMAL LOW (ref 26.0–34.0)
MCHC: 31.9 g/dL (ref 30.0–36.0)
MCV: 80.6 fL (ref 78.0–100.0)
PLATELETS: 200 10*3/uL (ref 150–400)
RBC: 2.88 MIL/uL — AB (ref 3.87–5.11)
RDW: 20 % — ABNORMAL HIGH (ref 11.5–15.5)
WBC: 7.1 10*3/uL (ref 4.0–10.5)

## 2015-09-26 LAB — GC/CHLAMYDIA PROBE AMP (~~LOC~~) NOT AT ARMC
CHLAMYDIA, DNA PROBE: NEGATIVE
Neisseria Gonorrhea: NEGATIVE

## 2015-09-26 MED ORDER — NORGESTIMATE-ETH ESTRADIOL 0.25-35 MG-MCG PO TABS: 1.0000 | ORAL_TABLET | Freq: Every day | ORAL | 11 refills | Status: DC

## 2015-09-26 MED ORDER — MEGESTROL ACETATE 40 MG PO TABS: 40.0000 mg | ORAL_TABLET | Freq: Three times a day (TID) | ORAL | 3 refills | Status: DC

## 2015-09-26 NOTE — Progress Notes (Signed)
Pt was presented discharge material along with prescriptions. Verbalized understanding and had no questions.

## 2015-09-26 NOTE — H&P (Signed)
Courtney Andersen MRN: [id number] DOB/AGE: Feb 15, 1981 35 y.o. Primary Care Physician:No PCP Per Patient Admit date: 09/25/2015 Chief Complaint: Heavy menstrual bleeding, severe anemia symptomatic HPI: This 35 year old female gravida 0 para 0 on no birth control, is admitted with severe anemia with hemoglobin of 4.2 after presenting to the emergency room with bleeding and fatigue lightheadedness related to the heavy menstrual bleeding. The bleeding actually began to reduce one day prior to arrival but the patient was symptomatic. She's been followed and has been offered birth control pills in the past. In the future she desires childbearing. She has a history of uterine fibroids. She is admitted for Megace therapy, transfusions and IV Feraheme Past Medical History:  Diagnosis Date  . Abnormal Pap smear   . Anemia   . Fibroids 11/24/2012  . Menorrhagia 11/24/2012  . Other and unspecified ovarian cyst 11/24/2012   Past Surgical History:  Procedure Laterality Date  . COLPOSCOPY W/ BIOPSY / CURETTAGE    . LEEP     Desires future pregnancy. 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     Family History  Problem Relation Age of Onset  . Hypertension Mother   . Diabetes Father   . Hypertension Father   . Diabetes Brother   . Cancer Paternal Grandmother   . Cancer Paternal Aunt    Has not been taking iron tablets due to constipation side effect Social History:  reports that she is a non-smoker but has been exposed to tobacco smoke. She has never used smokeless tobacco. She reports that she drinks alcohol. She reports that she does not use drugs.   Allergies: No Known Allergies  Medications Prior to Admission  Medication Sig Dispense Refill  . norgestimate-ethinyl estradiol (MONO-LINYAH) 0.25-35 MG-MCG tablet Take 1 tablet by mouth daily. 28 tablet 11       [id number] from the symptoms mentioned above,there are no  other symptoms referable to all systems reviewed.  Physical Exam: Blood pressure 112/62, pulse 84, temperature 98 F (36.7 C), temperature source Oral, resp. rate 18, height 5\' 7"  (1.702 m), weight 90.7 kg (200 lb), last menstrual period 08/26/2015, SpO2 100 %. Physical Examination: General appearance - alert, well appearing, and in no distress, oriented to person, place, and time and normal appearing weight Mental status - alert, oriented to person, place, and time, normal mood, behavior, speech, dress, motor activity, and thought processes Eyes - pupils equal and reactive, extraocular eye movements intact Abdomen - soft, nontender, nondistended, no masses or organomegaly Pelvic - examination not indicated, was examined in the emergency room and will be scheduled for follow-up as outpatient     Recent Labs  09/25/15 1044 09/26/15 0411  WBC 7.6 7.1  NEUTROABS 6.0  --   HGB 4.2* 7.4*  HCT 13.8* 23.2*  MCV 72.6* 80.6  PLT 228 200    Recent Labs  09/25/15 1044  NA 136  K 3.6  CL 108  CO2 23  GLUCOSE 114*  BUN 9  CREATININE 0.64  CALCIUM 7.9*  lablast2(ast:2,ALT:2,alkphos:2,bilitot:2,prot:2,albumin:2)@    Recent Results (from the past 240 hour(s))  Wet prep, genital     Status: Abnormal   Collection Time: 09/25/15 11:22 AM  Result Value Ref Range Status   Yeast Wet Prep HPF POC NONE SEEN NONE SEEN Final   Trich, Wet Prep NONE SEEN NONE SEEN Final   Clue Cells Wet Prep HPF POC PRESENT (A) NONE  SEEN Final   WBC, Wet Prep HPF POC NONE SEEN NONE SEEN Final   Sperm NONE SEEN  Final     US Transvaginal Non-ob  Result Date: 09/25/2015 CLINICAL DATA:  Three weeks of abnormal uterine bleeding, menorrhagia, anemia, history of fibroids on prior ultrasound EXAM: TRANSABDOMINAL AND TRANSVAGINAL ULTRASOUND OF PELVIS TECHNIQUE: Both transabdominal and transvaginal ultrasound examinations of the pelvis were performed. Transabdominal technique was performed for global imaging of the  pelvis including uterus, ovaries, adnexal regions, and pelvic cul-de-sac. It was necessary to proceed with endovaginal exam following the transabdominal exam to visualize the uterus, endometrium, ovaries, and adnexal regions. COMPARISON:  10.0 x 4.2 x 5.4 cm. FINDINGS: Uterus Measurements: 10.0 x 4.2 x 5.4 cm .In the right aspect of the uterine corpus there is a 4.5 x 3.1 x 4.9 cm hypoechoic focus with distal shadowing most compatible with a fibroid. A second lower corpus fibroid is suspected anteriorly measuring 2.3 x 2.1 x 2.3 cm. Endometrium Thickness: 5.1.  No focal abnormality visualized. Right ovary Measurements: 2.2 x 1.7 x 2.7 cm. Visualization is limited due to bowel gas. The echotexture is normal. Left ovary Measurements: 2.5 x 1.2 x 2.4 cm. Visualization is limited due to bowel gas. The echotexture is normal. Other findings None IMPRESSION: 1. Two uterine fibroids with the largest measuring 4.9 cm. Normal endometrial stripe thickening. If bleeding remains unresponsive to hormonal or medical therapy, sonohysterogram should be considered for focal lesion work-up. (Ref: Radiological Reasoning: Algorithmic Workup of Abnormal Vaginal Bleeding with Endovaginal Sonography and Sonohysterography. AJR 2008; [id number]) 2. Somewhat limited visualization of the ovaries. There are grossly normal in size and echotexture. 3. No free pelvic fluid. Electronically Signed   By: David  Martinique M.D.   On: 09/25/2015 14:15  US Pelvis Complete  Result Date: 09/25/2015 CLINICAL DATA:  Three weeks of abnormal uterine bleeding, menorrhagia, anemia, history of fibroids on prior ultrasound EXAM: TRANSABDOMINAL AND TRANSVAGINAL ULTRASOUND OF PELVIS TECHNIQUE: Both transabdominal and transvaginal ultrasound examinations of the pelvis were performed. Transabdominal technique was performed for global imaging of the pelvis including uterus, ovaries, adnexal regions, and pelvic cul-de-sac. It was necessary to proceed with endovaginal  exam following the transabdominal exam to visualize the uterus, endometrium, ovaries, and adnexal regions. COMPARISON:  10.0 x 4.2 x 5.4 cm. FINDINGS: Uterus Measurements: 10.0 x 4.2 x 5.4 cm .In the right aspect of the uterine corpus there is a 4.5 x 3.1 x 4.9 cm hypoechoic focus with distal shadowing most compatible with a fibroid. A second lower corpus fibroid is suspected anteriorly measuring 2.3 x 2.1 x 2.3 cm. Endometrium Thickness: 5.1.  No focal abnormality visualized. Right ovary Measurements: 2.2 x 1.7 x 2.7 cm. Visualization is limited due to bowel gas. The echotexture is normal. Left ovary Measurements: 2.5 x 1.2 x 2.4 cm. Visualization is limited due to bowel gas. The echotexture is normal. Other findings None IMPRESSION: 1. Two uterine fibroids with the largest measuring 4.9 cm. Normal endometrial stripe thickening. If bleeding remains unresponsive to hormonal or medical therapy, sonohysterogram should be considered for focal lesion work-up. (Ref: Radiological Reasoning: Algorithmic Workup of Abnormal Vaginal Bleeding with Endovaginal Sonography and Sonohysterography. AJR 2008; [id number]) 2. Somewhat limited visualization of the ovaries. There are grossly normal in size and echotexture. 3. No free pelvic fluid. Electronically Signed   By: David  Martinique M.D.   On: 09/25/2015 14:15  Impression: Menorrhagia with anemia, uterine fibroids, desire for future fertility Active Problems:   Anemia     Plan: The  patient is to receive transfusions overnight and will be discharged for outpatient care will be suppressed with oral contraceptives, have sonohysterogram scheduled in the office in the near future      Katie Faraone V   09/26/2015, 7:51 AM

## 2015-09-26 NOTE — Discharge Summary (Signed)
Physician Discharge Summary  Patient ID: Courtney Andersen MRN: [id number] DOB/AGE: 34-22-1982 34 y.o.  Admit date: 09/25/2015 Discharge date: 09/26/2015  Admission Diagnoses:Menorrhagia, abnormal uterine bleeding, severe symptomatic anemia, uterine fibroids  Discharge Diagnoses:  Active Problems:   Menorrhagia   Fibroids   Anemia   Abnormal uterine bleeding (AUB)   Discharged Condition: good  Hospital Course: This 35 year old female gravida 0 para 0 was admitted emergency room on 09/25/2015 after presenting with symptomatic anemia with hemoglobin 4.2 at the end of the very long and heavy menses with extensive clotting. The patient has a history of uterine fibroids has not been on any oral contraceptives recently, and had quit taking her iron for the chronic abnormal uterine bleeding. Last ultrasound for uterus was 2 years ago  Consults: gynecology  Significant Diagnostic Studies: labs:  CBC Latest Ref Rng & Units 09/26/2015 09/25/2015 11/01/2013  WBC 4.0 - 10.5 K/uL 7.1 7.6 -  Hemoglobin 12.0 - 15.0 g/dL 7.4(L) 4.2(LL) 11.1(A)  Hematocrit 36.0 - 46.0 % 23.2(L) 13.8(L) -  Platelets 150 - 400 K/uL 200 228 -     Treatments: Transfusion 3 units packed cells, Megace 40 mg 3 times a day, IV ferriheme 550 mg repeat pelvic ultrasound   Discharge Exam: Blood pressure 112/62, pulse 84, temperature 98 F (36.7 C), temperature source Oral, resp. rate 18, height 5\' 7"  (1.702 m), weight 90.7 kg (200 lb), last menstrual period 08/26/2015, SpO2 100 %. General appearance: alert, cooperative and no distress Head: Normocephalic, without obvious abnormality, atraumatic Resp: Unlabored breathing GI: soft, non-tender; bowel sounds normal; no masses,  no organomegaly  Disposition: Final discharge disposition not confirmed  discharge home     Medication List    ASK your doctor about these medications   norgestimate-ethinyl estradiol 0.25-35 MG-MCG tablet Commonly known as:  MONO-LINYAH Take 1  tablet by mouth daily.      Follow-up Information    [facility] .   Specialty:  Obstetrics and Gynecology Why:  Please see Dr Glo Herring or Dr Elonda Husky within 2 weeks for followup Contact information: [address] [place]          Signed: Jonnie Kind 09/26/2015, 8:01 AM

## 2015-10-09 ENCOUNTER — Ambulatory Visit (INDEPENDENT_AMBULATORY_CARE_PROVIDER_SITE_OTHER): Payer: PRIVATE HEALTH INSURANCE | Admitting: Obstetrics and Gynecology

## 2015-10-09 ENCOUNTER — Encounter: Payer: Self-pay | Admitting: Obstetrics and Gynecology

## 2015-10-09 VITALS — BP 120/80 | Ht 67.0 in | Wt 200.0 lb

## 2015-10-09 DIAGNOSIS — N939 Abnormal uterine and vaginal bleeding, unspecified: Secondary | ICD-10-CM

## 2015-10-09 DIAGNOSIS — D500 Iron deficiency anemia secondary to blood loss (chronic): Secondary | ICD-10-CM

## 2015-10-09 DIAGNOSIS — D251 Intramural leiomyoma of uterus: Secondary | ICD-10-CM | POA: Diagnosis not present

## 2015-10-09 LAB — POCT HEMOGLOBIN: HEMOGLOBIN: 10.6 g/dL — AB (ref 12.2–16.2)

## 2015-10-09 NOTE — Progress Notes (Signed)
Patient ID: Courtney Andersen, female   DOB: 1980/04/03, 35 y.o.   MRN: [id number]    [place] [facility] Visit  @DATE @            Patient name: Courtney Andersen MRN [id number]  Date of birth: [birth year]  CC & HPI:  Courtney Andersen is a 35 y.o. female presenting today for f/u for further treatment of uterine fibroids. Pt was admitted to the hospital on 09/25/15 for menorrhagia, AUB, severe symptomatic anemia (HGB 4.2) and uterine fibroids. During her stay, she was treated with Transfusion 3 units packed cells, Megace 40 mg 3 times a day, IV ferriheme 550 mg repeat pelvic ultrasound. Pelvic US showed two uterine fibroids, with the largest measuring 4.9 cm. Pt notes she has been feeling well since discharge from the hospital.   Pt states she is currently taking continuous birth control. She reports she would like to have more children.   ROS:  Review of Systems  Constitutional:       No complaints; discussion only; feeling well since d/c from hospital   Pertinent History Reviewed:   Reviewed: Significant for fibroids Medical         Past Medical History:  Diagnosis Date  . Abnormal Pap smear   . Anemia   . Fibroids 11/24/2012  . Menorrhagia 11/24/2012  . Other and unspecified ovarian cyst 11/24/2012                              Surgical Hx:    Past Surgical History:  Procedure Laterality Date  . COLPOSCOPY W/ BIOPSY / CURETTAGE    . LEEP     Medications: Reviewed & Updated - see associated section                       Current Outpatient Prescriptions:  .  norgestimate-ethinyl estradiol (MONO-LINYAH) 0.25-35 MG-MCG tablet, Take 1 tablet by mouth daily., Disp: 1 Package, Rfl: 11 .  megestrol (MEGACE) 40 MG tablet, Take 1 tablet (40 mg total) by mouth 3 (three) times daily. Until bleeding stops completely, then d/c (Patient not taking: Reported on 10/09/2015), Disp: 45 tablet, Rfl: 3   Social History: Reviewed -  reports that she is a non-smoker but has been exposed to tobacco smoke. She has  never used smokeless tobacco.  Objective Findings:  Vitals: Blood pressure 120/80, height 5\' 7"  (1.702 m), weight 200 lb (90.7 kg), last menstrual period 08/26/2015.  Physical Examination: Discussion only  Discussed with pt further treatment of fibroids, including surgical options. At end of discussion, pt had opportunity to ask questions and has no further questions at this time.   Greater than 50% was spent in counseling and coordination of care with the patient. Total time greater than: 15 minutes    Assessment & Plan:   A:  1. Uterine fibroids, currently taking continuous oral contraceptives  2. Recent hospital admission for menorrhagia, AUB, severe symptomatic anemia (HGB 4.2) and uterine fibroids 3. Seeking conception in the future  4. HGB fingerstick in office today - 10.6  P:  1. Continue taking continuous oral contraceptives 2. F/u in 1 month if needed  3. Further communication via [place]  4. Will provide referral for consideration of myomectomy     By signing my name below, I, Courtney Andersen, attest that this documentation has been prepared under the direction and in the presence of Courtney Kind, MD. Electronically Signed: Harmon Andersen  Rodena Piety, ED Scribe. 10/09/15. 2:05 PM.  I personally performed the services described in this documentation, which was SCRIBED in my presence. The recorded information has been reviewed and considered accurate. It has been edited as necessary during review. Courtney Kind, MD

## 2015-10-17 ENCOUNTER — Telehealth: Payer: Self-pay | Admitting: Obstetrics and Gynecology

## 2015-10-24 NOTE — Telephone Encounter (Signed)
The patient was to follow up in a month for sonohysterogram, to consider whether to refer or do the surgery here. If she wishes to be seen by specialist before that, she may need to see Dr Neldon Labella., [phone] in [place] at [facility], or Dr

## 2015-10-30 ENCOUNTER — Telehealth: Payer: Self-pay | Admitting: Obstetrics and Gynecology

## 2015-10-30 NOTE — Telephone Encounter (Signed)
Pt states taking the Mono-Linyah birth control and still having vaginal bleeding did not know if Dr. Glo Herring wanted pt to take the Megace or not. Pt states Dr.Ferguson was going to refer to another physician due to possible surgery (Myomectomy).   Please advise.

## 2015-10-31 NOTE — Telephone Encounter (Signed)
I think this message was misdirected, I will redirect to Dr Glo Herring

## 2015-11-02 ENCOUNTER — Other Ambulatory Visit (INDEPENDENT_AMBULATORY_CARE_PROVIDER_SITE_OTHER): Payer: PRIVATE HEALTH INSURANCE | Admitting: *Deleted

## 2015-11-02 ENCOUNTER — Encounter: Payer: Self-pay | Admitting: *Deleted

## 2015-11-02 DIAGNOSIS — D649 Anemia, unspecified: Secondary | ICD-10-CM

## 2015-11-02 LAB — POCT HEMOGLOBIN: Hemoglobin: 13.9 g/dL (ref 12.2–16.2)

## 2015-11-02 NOTE — Progress Notes (Signed)
Pt here for hemoglobin check. Hemoglobin was great at 13.9!! Pt to return as scheduled or sooner if need be. [place]

## 2015-11-08 ENCOUNTER — Encounter: Payer: Self-pay | Admitting: Obstetrics and Gynecology

## 2015-11-08 ENCOUNTER — Ambulatory Visit (INDEPENDENT_AMBULATORY_CARE_PROVIDER_SITE_OTHER): Payer: PRIVATE HEALTH INSURANCE | Admitting: Obstetrics and Gynecology

## 2015-11-08 VITALS — BP 144/70 | HR 66 | Ht 67.0 in | Wt 198.8 lb

## 2015-11-08 DIAGNOSIS — D250 Submucous leiomyoma of uterus: Secondary | ICD-10-CM

## 2015-11-08 DIAGNOSIS — R030 Elevated blood-pressure reading, without diagnosis of hypertension: Secondary | ICD-10-CM | POA: Diagnosis not present

## 2015-11-08 DIAGNOSIS — D500 Iron deficiency anemia secondary to blood loss (chronic): Secondary | ICD-10-CM

## 2015-11-08 NOTE — Progress Notes (Signed)
Patient ID: Courtney Andersen, female   DOB: 1980/09/03, 35 y.o.   MRN: [id number]    [facility] Visit  @DATE @            Patient name: Courtney Andersen MRN [id number]  Date of birth: [birth year]  CC & HPI:   Chief Complaint  Patient presents with  . discuss surgery    Myomectomy     Courtney Andersen is a 35 y.o. female presenting today for discussion of possible Myomectomy. Pt states she has not had a period since admission to the hospital in 09/2015 d/t taking Megace, but notes that the bleeding restarts if she stops taking Megace. Her last fingerstick Hgb in office on 11/02/15 was 13.9.   ROS:  Review of Systems  Constitutional:       No complaints; discussion only    Pertinent History Reviewed:   Reviewed: Significant for fibroids Medical         Past Medical History:  Diagnosis Date  . Abnormal Pap smear   . Anemia   . Fibroids 11/24/2012  . Menorrhagia 11/24/2012  . Other and unspecified ovarian cyst 11/24/2012                              Surgical Hx:    Past Surgical History:  Procedure Laterality Date  . COLPOSCOPY W/ BIOPSY / CURETTAGE    . LEEP     Medications: Reviewed & Updated - see associated section                       Current Outpatient Prescriptions:  .  megestrol (MEGACE) 40 MG tablet, Take 1 tablet (40 mg total) by mouth 3 (three) times daily. Until bleeding stops completely, then d/c, Disp: 45 tablet, Rfl: 3 .  norgestimate-ethinyl estradiol (MONO-LINYAH) 0.25-35 MG-MCG tablet, Take 1 tablet by mouth daily., Disp: 1 Package, Rfl: 11   Social History: Reviewed -  reports that she is a non-smoker but has been exposed to tobacco smoke. She has never used smokeless tobacco.  Objective Findings:  Vitals: Blood pressure (!) 144/70, pulse 66, height 5\' 7"  (1.702 m), weight 198 lb 12.8 oz (90.2 kg).  Physical Examination: Discussion only  Discussed with pt risks and benefits of laparoscopic vs hysteroscopic myomectomy. At end of discussion, pt had  opportunity to ask questions and has no further questions at this time.   Greater than 50% was spent in counseling and coordination of care with the patient. Total time greater than: 15 minutes    Assessment & Plan:   A:  1. Encounter for discussion of Myomectomy   P:  1. Will schedule sonohysterogram 11/30/15 prior to determining laparoscopic vs hysteroscopic myomectomy   2 continue Megace for now.   By signing my name below, I, Hansel Feinstein, attest that this documentation has been prepared under the direction and in the presence of Jonnie Kind, MD. Electronically Signed: Hansel Feinstein, ED Scribe. 11/08/15. 9:25 AM.  I personally performed the services described in this documentation, which was SCRIBED in my presence. The recorded information has been reviewed and considered accurate. It has been edited as necessary during review. Jonnie Kind, MD

## 2015-11-29 ENCOUNTER — Other Ambulatory Visit: Payer: Self-pay | Admitting: Obstetrics and Gynecology

## 2015-11-29 DIAGNOSIS — N921 Excessive and frequent menstruation with irregular cycle: Secondary | ICD-10-CM

## 2015-11-30 ENCOUNTER — Other Ambulatory Visit: Payer: Self-pay | Admitting: Obstetrics and Gynecology

## 2015-11-30 ENCOUNTER — Other Ambulatory Visit: Payer: PRIVATE HEALTH INSURANCE

## 2015-11-30 ENCOUNTER — Ambulatory Visit (INDEPENDENT_AMBULATORY_CARE_PROVIDER_SITE_OTHER): Payer: PRIVATE HEALTH INSURANCE

## 2015-11-30 ENCOUNTER — Ambulatory Visit (INDEPENDENT_AMBULATORY_CARE_PROVIDER_SITE_OTHER): Payer: PRIVATE HEALTH INSURANCE | Admitting: Obstetrics and Gynecology

## 2015-11-30 DIAGNOSIS — D259 Leiomyoma of uterus, unspecified: Secondary | ICD-10-CM | POA: Diagnosis not present

## 2015-11-30 DIAGNOSIS — N921 Excessive and frequent menstruation with irregular cycle: Secondary | ICD-10-CM

## 2015-11-30 DIAGNOSIS — N854 Malposition of uterus: Secondary | ICD-10-CM

## 2015-11-30 DIAGNOSIS — D250 Submucous leiomyoma of uterus: Secondary | ICD-10-CM

## 2015-11-30 DIAGNOSIS — R938 Abnormal findings on diagnostic imaging of other specified body structures: Secondary | ICD-10-CM | POA: Diagnosis not present

## 2015-11-30 DIAGNOSIS — D252 Subserosal leiomyoma of uterus: Principal | ICD-10-CM

## 2015-11-30 NOTE — Progress Notes (Signed)
[place] SONOHYSTEROGRAM W/TV: Heterogeneous anteverted uterus w/mult.fibroids,(#1) posterior mid/lus subserosal fibroid 8.3 x 5.4 x 6.3 cm,(#2) hypervascular submucosal fibroid 3 x 2.9 x 1.9 cm,thickened endometrium 21 mm,normal ov's bilat,no free fluid,ov's appear mobile, Dr.Ferguson preformed the sonohysterogram,saline infusion was monitored by [place] throughout procedure.The saline outlined the submucosal fibroid.

## 2015-12-05 NOTE — Progress Notes (Addendum)
[facility] Visit  @DATE @            Patient name: Courtney Andersen MRN [id number]  Date of birth: 06/03/1980  CC & HPI:  Courtney Andersen is a 35 y.o. female presenting today for Follow-up from sonohysterogram performed today by myself in our office. She is a 35 year old gravida 0 para 0 desiring future fertility he is recently had severe anemia in response to profuse menorrhagia with hemoglobin dropped from 12-4.3 requiring transfusions as well as as intravenous Feraheme. She is currently on Megace to suppress her endometrium. Sonohysterogram performed 11/30/2015 by me, day of this visit, shows a submucous myoma that appears to protrude into the endometrial cavity and may have a smal stalk. it'll require resection. And would likely be resectable by use of Myosure or other hysteroscopic resection device. Since she'll require fertility workup thought that it would be appropriate for her to be discussed with Dr. Kerin Perna.. Left message for Dr. Kerin Perna 12/05/2015 for his call after reviewing the record  ROS:  ROS known fibroids since at least 2014 on ultrasound today it appears that there is a posterior subserous myoma as well Pertinent History Reviewed:   Reviewed: Significant for Colposcopy, LEEP date unknown. She had a normal Pap smear in 2014, and 2016 with negative HPV Medical         Past Medical History:  Diagnosis Date  . Abnormal Pap smear   . Anemia   . Fibroids 11/24/2012  . Menorrhagia 11/24/2012  . Other and unspecified ovarian cyst 11/24/2012                              Surgical Hx:    Past Surgical History:  Procedure Laterality Date  . COLPOSCOPY W/ BIOPSY / CURETTAGE    . LEEP     Medications: Reviewed & Updated - see associated section                       Current Outpatient Prescriptions:  .  megestrol (MEGACE) 40 MG tablet, Take 1 tablet (40 mg total) by mouth 3 (three) times daily. Until bleeding stops completely, then d/c, Disp: 45 tablet, Rfl: 3 .   norgestimate-ethinyl estradiol (MONO-LINYAH) 0.25-35 MG-MCG tablet, Take 1 tablet by mouth daily., Disp: 1 Package, Rfl: 11   Social History: Reviewed -  reports that she is a non-smoker but has been exposed to tobacco smoke. She has never used smokeless tobacco.  Objective Findings:  Vitals: Last menstrual period 09/22/2015.  Physical Examination: General appearance - alert, well appearing, and in no distress and normal appearing weight Mental status - alert, oriented to person, place, and time, normal mood, behavior, speech, dress, motor activity, and thought processes Abdomen - soft, nontender, nondistended, no masses or organomegaly Pelvic - normal external genitalia, vulva, vagina, cervix, uterus and adnexa, VULVA: normal appearing vulva with no masses, tenderness or lesions, VAGINA: normal appearing vagina with normal color and discharge, no lesions, CERVIX: normal appearing cervix without discharge or lesions, UTERUS: anteverted, enlarged to 6 week's size, please see the sonohysterogram report  ADNEXA: normal adnexa in size, nontender and no masses   Assessment & Plan:   A:  1. Submucous myoma, history of intramural and submucous myomas 2. History of anemia, currently stable on Megace 3. Desire for preservation of fertility  P:  1. Plan will discuss with Dr. Kerin Perna, and decide whether he is to resect the myoma  as a part of an overall fertility workup. She might be a good candidate for a concurrent laparoscopy is a part of her fertility workup.  2. Messages been left with Dr. Kerin Perna to discuss her case after review of sonohysterogram

## 2015-12-05 NOTE — Progress Notes (Signed)
The following noticing, incomplete note that we are attempting to close. Please ignore this documentation. The visit is documentedelsewhere for the care received 11/30/2015  [facility] Visit  @DATE @            Patient name: Courtney Andersen MRN [id number]  Date of birth: 1980-08-23  CC & HPI:   ROS:  ROS   Pertinent History Reviewed:   Reviewed: Significant for  Medical         Past Medical History:  Diagnosis Date  . Abnormal Pap smear   . Anemia   . Fibroids 11/24/2012  . Menorrhagia 11/24/2012  . Other and unspecified ovarian cyst 11/24/2012                              Surgical Hx:    Past Surgical History:  Procedure Laterality Date  . COLPOSCOPY W/ BIOPSY / CURETTAGE    . LEEP     Medications: Reviewed & Updated - see associated section                       Current Outpatient Prescriptions:  .  megestrol (MEGACE) 40 MG tablet, Take 1 tablet (40 mg total) by mouth 3 (three) times daily. Until bleeding stops completely, then d/c, Disp: 45 tablet, Rfl: 3 .  norgestimate-ethinyl estradiol (MONO-LINYAH) 0.25-35 MG-MCG tablet, Take 1 tablet by mouth daily., Disp: 1 Package, Rfl: 11   Social History: Reviewed -  reports that she is a non-smoker but has been exposed to tobacco smoke. She has never used smokeless tobacco.  Objective Findings:  Vitals: Last menstrual period 09/22/2015.  Physical Examination:    Assessment & Plan:   A:  1.   P:  1.  Please ignore this note

## 2015-12-28 ENCOUNTER — Ambulatory Visit (INDEPENDENT_AMBULATORY_CARE_PROVIDER_SITE_OTHER): Payer: PRIVATE HEALTH INSURANCE | Admitting: Obstetrics and Gynecology

## 2015-12-28 ENCOUNTER — Encounter: Payer: Self-pay | Admitting: Obstetrics and Gynecology

## 2015-12-28 VITALS — BP 130/78 | HR 62 | Wt 199.8 lb

## 2015-12-28 DIAGNOSIS — N939 Abnormal uterine and vaginal bleeding, unspecified: Secondary | ICD-10-CM

## 2015-12-28 DIAGNOSIS — D259 Leiomyoma of uterus, unspecified: Secondary | ICD-10-CM | POA: Diagnosis not present

## 2015-12-28 NOTE — Progress Notes (Signed)
°   [facility] Visit  12/28/15            Patient name: Courtney Andersen MRN [id number]  Date of birth: [birth year]  CC & HPI:  Courtney Andersen is a 35 y.o. female presenting today for follow up regarding her fertility work up.   ROS:  ROS No complaints at this time.   Pertinent History Reviewed:   Reviewed: Significant for fibroids, menorrhagia, LEEP Medical         Past Medical History:  Diagnosis Date   Abnormal Pap smear    Anemia    Fibroids 11/24/2012   Menorrhagia 11/24/2012   Other and unspecified ovarian cyst 11/24/2012                              Surgical Hx:    Past Surgical History:  Procedure Laterality Date   COLPOSCOPY W/ BIOPSY / CURETTAGE     LEEP     Medications: Reviewed & Updated - see associated section                       Current Outpatient Prescriptions:    megestrol (MEGACE) 40 MG tablet, Take 1 tablet (40 mg total) by mouth 3 (three) times daily. Until bleeding stops completely, then d/c, Disp: 45 tablet, Rfl: 3   norgestimate-ethinyl estradiol (MONO-LINYAH) 0.25-35 MG-MCG tablet, Take 1 tablet by mouth daily., Disp: 1 Package, Rfl: 11   Social History: Reviewed -  reports that she is a non-smoker but has been exposed to tobacco smoke. She has never used smokeless tobacco.  Objective Findings:  Vitals: Blood pressure 130/78, pulse 62, weight 199 lb 12.8 oz (90.6 kg), last menstrual period 09/22/2015.  Physical Examination: General appearance - alert, well appearing, and in no distress Mental status - alert, oriented to person, place, and time Pelvic - examination not indicated   Assessment & Plan:   A:  1. 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 Refer to Dr. Kerin Perna or other REI for resection and consideration of laparoscopy Phone contact made with his office and pt allowed to schedule visit during our interaction to confirm effective transfer of care     By signing my name  below, I, Sonum Patel, attest that this documentation has been prepared under the direction and in the presence of Jonnie Kind, MD. Electronically Signed: Sonum Patel, Education administrator. 12/28/15. 10:27 AM.  `````Attestation of Attending Supervision of Advanced Practitioner: Evaluation and management procedures were performed by the PA/NP/CNM/OB Fellow under my supervision/collaboration. Chart reviewed and agree with management and plan.  FERGUSON,JOHN V 12/28/2015 10:23 PM    s

## 2016-01-04 ENCOUNTER — Telehealth: Payer: Self-pay | Admitting: *Deleted

## 2016-01-04 NOTE — Telephone Encounter (Signed)
Pt wants to discuss myomectomy with Dr.Ferguson due to Dr. Kerin Perna cannot perform the procedure until after January 1st. Pt given an appt to discuss with Dr. Glo Herring.

## 2016-01-18 ENCOUNTER — Ambulatory Visit (INDEPENDENT_AMBULATORY_CARE_PROVIDER_SITE_OTHER): Payer: PRIVATE HEALTH INSURANCE | Admitting: Obstetrics and Gynecology

## 2016-01-18 ENCOUNTER — Encounter: Payer: Self-pay | Admitting: Obstetrics and Gynecology

## 2016-01-18 VITALS — BP 150/80 | HR 95 | Ht 67.0 in | Wt 202.6 lb

## 2016-01-18 DIAGNOSIS — D250 Submucous leiomyoma of uterus: Secondary | ICD-10-CM

## 2016-01-18 DIAGNOSIS — D252 Subserosal leiomyoma of uterus: Secondary | ICD-10-CM

## 2016-01-18 NOTE — Progress Notes (Signed)
Patient ID: Courtney Andersen, female   DOB: 03-Aug-1980, 35 y.o.   MRN: [id number] Preoperative History and Physical  Courtney Andersen is a 35 y.o. 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 No significant preoperative concerns. Pt states that she followed up with her appointment with Dr. Kerin Perna but was unable to have the procedure completed by the end of the year. Pt hasn't tried any medications for the relief of her symptoms. Pt denies any other symptoms.    Proposed surgery: myomectomy  Past Medical History:  Diagnosis Date  . Abnormal Pap smear   . Anemia   . Fibroids 11/24/2012  . Menorrhagia 11/24/2012  . Other and unspecified ovarian cyst 11/24/2012   Past Surgical History:  Procedure Laterality Date  . COLPOSCOPY W/ BIOPSY / CURETTAGE    . LEEP     OB History  Gravida Para Term Preterm AB Living  0 0 0 0 0 0  SAB TAB Ectopic Multiple Live Births  0 0 0 0 0      Patient denies any other pertinent gynecologic issues.   Current Outpatient Prescriptions on File Prior to Visit  Medication Sig Dispense Refill  . megestrol (MEGACE) 40 MG tablet Take 1 tablet (40 mg total) by mouth 3 (three) times daily. Until bleeding stops completely, then d/c 45 tablet 3  . norgestimate-ethinyl estradiol (MONO-LINYAH) 0.25-35 MG-MCG tablet Take 1 tablet by mouth daily. 1 Package 11   No current facility-administered medications on file prior to visit.    No Known Allergies  Social History:   reports that she is a non-smoker but has been exposed to tobacco smoke. She has never used smokeless tobacco. She reports that she drinks alcohol. She reports that she does not use drugs.  Family History  Problem Relation Age of Onset  . Hypertension Mother   . Diabetes Father   . Hypertension Father   . Diabetes Brother   . Cancer Paternal Grandmother   . Cancer Paternal Aunt     Review of Systems: Noncontributory  PHYSICAL EXAM: Blood pressure (!)  150/80, pulse 95, height 5\' 7"  (1.702 m), weight 202 lb 9.6 oz (91.9 kg), last menstrual period 10/01/2015. General appearance - alert, well appearing, and in no distress Chest - clear to auscultation, no wheezes, rales or rhonchi, symmetric air entry Heart - normal rate and regular rhythm Abdomen - soft, nontender, nondistended, no masses or organomegaly Pelvic  VULVA: normal appearing vulva with no masses, tenderness or lesions,  VAGINA: normal appearing vagina with normal color, no lesions, moderate amount of brown discharge CERVIX: normal appearing cervix without discharge or lesions, everted s/p LEEP UTERUS: uterus is normal shape, consistency and nontender, anteverted, enlarged to 10 week's size, anteverted with posterior fibroid.  Extremities - peripheral pulses normal, no pedal edema, no clubbing or cyanosis  Discussion: 1. Discussed with pt risks and benefits of myomectomy via hysteroscopic resection  At end of discussion, pt had opportunity to ask questions and has no further questions at this time.   Specific discussion of myomectomy via hysteroscopic resection as noted above. Greater than 50% was spent in counseling and coordination of care with the patient.  We have agreed to hysteroscopic resection of myoma, to be done at [facility].  Total time greater than: 15 minutes.   Labs: No results found for this or any previous visit (from the past 336 hour(s)).  Imaging Studies: No results found.  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 Abnormal uterine bleeding (AUB) 09/26/2015  . Anemia 09/25/2015  . Menorrhagia 11/24/2012  . Fibroids submucous 11/24/2012  . Other and unspecified ovarian cyst 11/24/2012  . History of abnormal Pap smear 07/09/2012    Plan: Patient will undergo surgical management with myomectomy via hysteroscopic resection that will be schedule after  02/06/16.   Jonnie Kind, MD 01/18/2016 2:43 PM   By signing my name below, I, Soijett Blue, attest that this documentation has been prepared under the direction and in the presence of Jonnie Kind, MD. Electronically Signed: Soijett Blue, ED Scribe. 01/18/16. 2:44 PM.  I personally performed the services described in this documentation, which was SCRIBED in my presence. The recorded information has been reviewed and considered accurate. It has been edited as necessary during review. Jonnie Kind, MD

## 2016-01-24 ENCOUNTER — Telehealth: Payer: Self-pay | Admitting: Obstetrics and Gynecology

## 2016-01-24 NOTE — Telephone Encounter (Signed)
Pt called stating that she spoke with Dr. Glo Herring earlier this week and discussed something about Dec the 5th. Please contact pt

## 2016-02-02 ENCOUNTER — Encounter (HOSPITAL_COMMUNITY): Payer: Self-pay | Admitting: *Deleted

## 2016-02-05 NOTE — OR Nursing (Signed)
SDS BB History Log given to lab for patient's history of blood transfusion. 

## 2016-02-08 ENCOUNTER — Ambulatory Visit (HOSPITAL_COMMUNITY): Payer: PRIVATE HEALTH INSURANCE | Admitting: Registered Nurse

## 2016-02-08 ENCOUNTER — Ambulatory Visit (HOSPITAL_COMMUNITY)
Admission: RE | Admit: 2016-02-08 | Discharge: 2016-02-08 | Disposition: A | Discharge status: Home / Self Care | Payer: PRIVATE HEALTH INSURANCE | Source: Ambulatory Visit | Attending: Obstetrics and Gynecology | Admitting: Obstetrics and Gynecology

## 2016-02-08 ENCOUNTER — Encounter (HOSPITAL_COMMUNITY)
Admission: RE | Disposition: A | Discharge status: Home / Self Care | Payer: Self-pay | Source: Ambulatory Visit | Attending: Obstetrics and Gynecology

## 2016-02-08 ENCOUNTER — Encounter (HOSPITAL_COMMUNITY): Payer: Self-pay | Admitting: Registered Nurse

## 2016-02-08 DIAGNOSIS — D219 Benign neoplasm of connective and other soft tissue, unspecified: Secondary | ICD-10-CM | POA: Diagnosis present

## 2016-02-08 DIAGNOSIS — D250 Submucous leiomyoma of uterus: Secondary | ICD-10-CM | POA: Diagnosis not present

## 2016-02-08 HISTORY — PX: DILATATION & CURETTAGE/HYSTEROSCOPY WITH MYOSURE: SHX6511

## 2016-02-08 HISTORY — DX: Personal history of other medical treatment: Z92.89

## 2016-02-08 HISTORY — DX: Personal history of other specified conditions: Z87.898

## 2016-02-08 LAB — CBC
HEMATOCRIT: 38.6 % (ref 36.0–46.0)
HEMOGLOBIN: 12.9 g/dL (ref 12.0–15.0)
MCH: 27.9 pg (ref 26.0–34.0)
MCHC: 33.4 g/dL (ref 30.0–36.0)
MCV: 83.5 fL (ref 78.0–100.0)
Platelets: 277 10*3/uL (ref 150–400)
RBC: 4.62 MIL/uL (ref 3.87–5.11)
RDW: 15.5 % (ref 11.5–15.5)
WBC: 10.2 10*3/uL (ref 4.0–10.5)

## 2016-02-08 LAB — PREGNANCY, URINE: Preg Test, Ur: NEGATIVE

## 2016-02-08 SURGERY — DILATATION & CURETTAGE/HYSTEROSCOPY WITH MYOSURE
Anesthesia: General | Site: Uterus

## 2016-02-08 MED ORDER — ACETAMINOPHEN-CODEINE #3 300-30 MG PO TABS: 2.0000 | ORAL_TABLET | ORAL | 0 refills | Status: DC | PRN

## 2016-02-08 MED ORDER — BUPIVACAINE-EPINEPHRINE (PF) 0.5% -1:200000 IJ SOLN
INTRAMUSCULAR | Status: DC | PRN
  Administered 2016-02-08: 20 mL via PERINEURAL

## 2016-02-08 MED ORDER — OXYCODONE HCL 5 MG/5ML PO SOLN: 5.0000 mg | Freq: Once | ORAL | Status: DC | PRN

## 2016-02-08 MED ORDER — MIDAZOLAM HCL 5 MG/5ML IJ SOLN
INTRAMUSCULAR | Status: DC | PRN
  Administered 2016-02-08: 2 mg via INTRAVENOUS

## 2016-02-08 MED ORDER — PROPOFOL 10 MG/ML IV BOLUS
INTRAVENOUS | Status: AC
  Filled 2016-02-08: qty 20

## 2016-02-08 MED ORDER — SODIUM CHLORIDE 0.9 % IR SOLN
Status: DC | PRN
  Administered 2016-02-08: 3000 mL

## 2016-02-08 MED ORDER — BUPIVACAINE-EPINEPHRINE (PF) 0.5% -1:200000 IJ SOLN
INTRAMUSCULAR | Status: AC
  Filled 2016-02-08: qty 30

## 2016-02-08 MED ORDER — KETOROLAC TROMETHAMINE 30 MG/ML IJ SOLN
INTRAMUSCULAR | Status: DC | PRN
  Administered 2016-02-08: 30 mg via INTRAVENOUS

## 2016-02-08 MED ORDER — CEFAZOLIN SODIUM-DEXTROSE 2-3 GM-% IV SOLR
INTRAVENOUS | Status: DC | PRN
  Administered 2016-02-08: 2 g via INTRAVENOUS

## 2016-02-08 MED ORDER — PROMETHAZINE HCL 25 MG/ML IJ SOLN: 6.2500 mg | INTRAMUSCULAR | Status: DC | PRN

## 2016-02-08 MED ORDER — ONDANSETRON HCL 4 MG/2ML IJ SOLN
INTRAMUSCULAR | Status: AC
  Filled 2016-02-08: qty 2

## 2016-02-08 MED ORDER — BUPIVACAINE HCL (PF) 0.5 % IJ SOLN
INTRAMUSCULAR | Status: AC
  Filled 2016-02-08: qty 30

## 2016-02-08 MED ORDER — FENTANYL CITRATE (PF) 100 MCG/2ML IJ SOLN
INTRAMUSCULAR | Status: AC
  Filled 2016-02-08: qty 2

## 2016-02-08 MED ORDER — LACTATED RINGERS IV SOLN: INTRAVENOUS | Status: DC

## 2016-02-08 MED ORDER — FENTANYL CITRATE (PF) 100 MCG/2ML IJ SOLN
INTRAMUSCULAR | Status: DC | PRN
  Administered 2016-02-08 (×4): 50 ug via INTRAVENOUS

## 2016-02-08 MED ORDER — LIDOCAINE HCL (CARDIAC) 20 MG/ML IV SOLN
INTRAVENOUS | Status: AC
  Filled 2016-02-08: qty 5

## 2016-02-08 MED ORDER — HYDROMORPHONE HCL 1 MG/ML IJ SOLN: 0.2500 mg | INTRAMUSCULAR | Status: DC | PRN

## 2016-02-08 MED ORDER — KETOROLAC TROMETHAMINE 30 MG/ML IJ SOLN
INTRAMUSCULAR | Status: AC
Start: 2016-02-08 — End: 2016-02-08
  Filled 2016-02-08: qty 1

## 2016-02-08 MED ORDER — DEXAMETHASONE SODIUM PHOSPHATE 10 MG/ML IJ SOLN
INTRAMUSCULAR | Status: AC
  Filled 2016-02-08: qty 1

## 2016-02-08 MED ORDER — DEXAMETHASONE SODIUM PHOSPHATE 10 MG/ML IJ SOLN
INTRAMUSCULAR | Status: DC | PRN
  Administered 2016-02-08: 10 mg via INTRAVENOUS

## 2016-02-08 MED ORDER — MEPERIDINE HCL 25 MG/ML IJ SOLN: 6.2500 mg | INTRAMUSCULAR | Status: DC | PRN

## 2016-02-08 MED ORDER — ONDANSETRON HCL 4 MG/2ML IJ SOLN
INTRAMUSCULAR | Status: DC | PRN
  Administered 2016-02-08: 4 mg via INTRAVENOUS

## 2016-02-08 MED ORDER — PROPOFOL 10 MG/ML IV BOLUS
INTRAVENOUS | Status: DC | PRN
  Administered 2016-02-08: 200 mg via INTRAVENOUS

## 2016-02-08 MED ORDER — LIDOCAINE 2% (20 MG/ML) 5 ML SYRINGE
INTRAMUSCULAR | Status: DC | PRN
  Administered 2016-02-08: 100 mg via INTRAVENOUS

## 2016-02-08 MED ORDER — SCOPOLAMINE 1 MG/3DAYS TD PT72
MEDICATED_PATCH | TRANSDERMAL | Status: AC
  Filled 2016-02-08: qty 1

## 2016-02-08 MED ORDER — KETOROLAC TROMETHAMINE 10 MG PO TABS: 10.0000 mg | ORAL_TABLET | Freq: Four times a day (QID) | ORAL | 0 refills | Status: DC | PRN

## 2016-02-08 MED ORDER — LACTATED RINGERS IV SOLN
INTRAVENOUS | Status: DC
  Administered 2016-02-08 (×2): via INTRAVENOUS

## 2016-02-08 MED ORDER — MIDAZOLAM HCL 2 MG/2ML IJ SOLN
INTRAMUSCULAR | Status: AC
  Filled 2016-02-08: qty 2

## 2016-02-08 MED ORDER — OXYCODONE HCL 5 MG PO TABS: 5.0000 mg | ORAL_TABLET | Freq: Once | ORAL | Status: DC | PRN

## 2016-02-08 MED ORDER — SCOPOLAMINE 1 MG/3DAYS TD PT72
1.0000 | MEDICATED_PATCH | Freq: Once | TRANSDERMAL | Status: DC
  Administered 2016-02-08: 1.5 mg via TRANSDERMAL

## 2016-02-08 SURGICAL SUPPLY — 19 items
CANISTER SUCT 3000ML PPV (MISCELLANEOUS) ×3 IMPLANT
CATH ROBINSON RED A/P 16FR (CATHETERS) ×3 IMPLANT
CLOTH BEACON ORANGE TIMEOUT ST (SAFETY) ×3 IMPLANT
CONTAINER PREFILL 10% NBF 60ML (FORM) ×6 IMPLANT
DECANTER SPIKE VIAL GLASS SM (MISCELLANEOUS) ×3 IMPLANT
DEVICE MYOSURE LITE (MISCELLANEOUS) IMPLANT
DEVICE MYOSURE REACH (MISCELLANEOUS) IMPLANT
FILTER ARTHROSCOPY CONVERTOR (FILTER) ×3 IMPLANT
GLOVE BIOGEL PI IND STRL 7.0 (GLOVE) ×1 IMPLANT
GLOVE BIOGEL PI INDICATOR 7.0 (GLOVE) ×2
GOWN STRL REUS W/TWL 2XL LVL3 (GOWN DISPOSABLE) ×3 IMPLANT
GOWN STRL REUS W/TWL LRG LVL3 (GOWN DISPOSABLE) ×3 IMPLANT
MYOSURE XL FIBROID REM (MISCELLANEOUS) ×3
PACK VAGINAL MINOR WOMEN LF (CUSTOM PROCEDURE TRAY) ×3 IMPLANT
PAD OB MATERNITY 4.3X12.25 (PERSONAL CARE ITEMS) ×3 IMPLANT
SEAL ROD LENS SCOPE MYOSURE (ABLATOR) ×3 IMPLANT
SYSTEM TISS REMOVAL MYSR XL RM (MISCELLANEOUS) ×1 IMPLANT
TUBING AQUILEX INFLOW (TUBING) ×3 IMPLANT
TUBING AQUILEX OUTFLOW (TUBING) ×3 IMPLANT

## 2016-02-08 NOTE — Discharge Instructions (Signed)
You may stop the Megace and the oral contraceptives at this time. Barrier contraception would be the preferred method for the next month. Avoid sexual activity until all bleeding stops HysteroscopyHysteroscopy Hysteroscopy is a procedure used for looking inside the womb (uterus). It may be done for various reasons, including:  To evaluate abnormal bleeding, fibroid (benign, noncancerous) tumors, polyps, scar tissue (adhesions), and possibly cancer of the uterus.  To look for lumps (tumors) and other uterine growths.  To look for causes of why a woman cannot get pregnant (infertility), causes of recurrent loss of pregnancy (miscarriages), or a lost intrauterine device (IUD).  To perform a sterilization by blocking the fallopian tubes from inside the uterus. In this procedure, a thin, flexible tube with a tiny light and camera on the end of it (hysteroscope) is used to look inside the uterus. A hysteroscopy should be done right after a menstrual period to be sure you are not pregnant. LET [facility] CARE PROVIDER KNOW ABOUT:   Any allergies you have.  All medicines you are taking, including vitamins, herbs, eye drops, creams, and over-the-counter medicines.  Previous problems you or members of your family have had with the use of anesthetics.  Any blood disorders you have.  Previous surgeries you have had.  Medical conditions you have. RISKS AND COMPLICATIONS  Generally, this is a safe procedure. However, as with any procedure, complications can occur. Possible complications include:  Putting a hole in the uterus.  Excessive bleeding.  Infection.  Damage to the cervix.  Injury to other organs.  Allergic reaction to medicines.  Too much fluid used in the uterus for the procedure. BEFORE THE PROCEDURE   Ask your health care provider about changing or stopping any regular medicines.  Do not take aspirin or blood thinners for 1 week before the procedure, or as directed by your  health care provider. These can cause bleeding.  If you smoke, do not smoke for 2 weeks before the procedure.  In some cases, a medicine is placed in the cervix the day before the procedure. This medicine makes the cervix have a larger opening (dilate). This makes it easier for the instrument to be inserted into the uterus during the procedure.  Do not eat or drink anything for at least 8 hours before the surgery.  Arrange for someone to take you home after the procedure. PROCEDURE   You may be given a medicine to relax you (sedative). You may also be given one of the following:  A medicine that numbs the area around the cervix (local anesthetic).  A medicine that makes you sleep through the procedure (general anesthetic).  The hysteroscope is inserted through the vagina into the uterus. The camera on the hysteroscope sends a picture to a TV screen. This gives the surgeon a good view inside the uterus.  During the procedure, air or a liquid is put into the uterus, which allows the surgeon to see better.  Sometimes, tissue is gently scraped from inside the uterus. These tissue samples are sent to a lab for testing. AFTER THE PROCEDURE   If you had a general anesthetic, you may be groggy for a couple hours after the procedure.  If you had a local anesthetic, you will be able to go home as soon as you are stable and feel ready.  You may have some cramping. This normally lasts for a couple days.  You may have bleeding, which varies from light spotting for a few days to menstrual-like  bleeding for 3-7 days. This is normal.  If your test results are not back during the visit, make an appointment with your health care provider to find out the results. This information is not intended to replace advice given to you by your health care provider. Make sure you discuss any questions you have with your health care provider. Document Released: 05/27/2000 Document Revised: 12/09/2012 Document  Reviewed: 09/17/2012 Elsevier Interactive Patient Education  2017 [place] INSTRUCTIONS: HYSTEROSCOPY  The following instructions have been prepared to help you care for yourself upon your return home.  May Remove Scop patch on or before 12/ 9/17  May take Ibuprofen after 8:00pm today  May take stool softner while taking narcotic pain medication to prevent constipation.  Drink plenty of water.  Personal hygiene:  Use sanitary pads for vaginal drainage, not tampons.  Shower the day after your procedure.  NO tub baths, pools or Jacuzzis for 2-3 weeks.  Wipe front to back after using the bathroom.  Activity and limitations:  Do NOT drive or operate any equipment for 24 hours. The effects of anesthesia are still present and drowsiness may result.  Do NOT rest in bed all day.  Walking is encouraged.  Walk up and down stairs slowly.  You may resume your normal activity in one to two days or as indicated by your physician. Sexual activity: NO intercourse for at least 2 weeks after the procedure, or as indicated by your Doctor.  Diet: Eat a light meal as desired this evening. You may resume your usual diet tomorrow.  Return to Work: You may resume your work activities in one to two days or as indicated by Marine scientist.  What to expect after your surgery: Expect to have vaginal bleeding/discharge for 2-3 days and spotting for up to 10 days. It is not unusual to have soreness for up to 1-2 weeks. You may have a slight burning sensation when you urinate for the first day. Mild cramps may continue for a couple of days. You may have a regular period in 2-6 weeks.  Call your doctor for any of the following:  Excessive vaginal bleeding or clotting, saturating and changing one pad every hour.  Inability to urinate 6 hours after discharge from hospital.  Pain not relieved by pain medication.  Fever of 100.4 F or greater.  Unusual vaginal discharge or  odor.  Return to office _________________Call for an appointment ___________________ Patients signature: ______________________ Nurses signature ________________________  Post Anesthesia Care Unit [phone]    Post Anesthesia Home Care Instructions  Activity: Get plenty of rest for the remainder of the day. A responsible adult should stay with you for 24 hours following the procedure.  For the next 24 hours, DO NOT: -Drive a car -Paediatric nurse -Drink alcoholic beverages -Take any medication unless instructed by your physician -Make any legal decisions or sign important papers.  Meals: Start with liquid foods such as gelatin or soup. Progress to regular foods as tolerated. Avoid greasy, spicy, heavy foods. If nausea and/or vomiting occur, drink only clear liquids until the nausea and/or vomiting subsides. Call your physician if vomiting continues.  Special Instructions/Symptoms: Your throat may feel dry or sore from the anesthesia or the breathing tube placed in your throat during surgery. If this causes discomfort, gargle with warm salt water. The discomfort should disappear within 24 hours.  If you had a scopolamine patch placed behind your ear for the management of post- operative nausea and/or vomiting:  1.  of post- operative nausea and/or vomiting: ° °1. The medication in the patch is effective for 72 hours, after which it should be removed.  Wrap patch in a tissue and discard in the trash. Wash hands thoroughly with soap and water. °2. You may remove the patch earlier than 72 hours if you experience unpleasant side effects which may include dry mouth, dizziness or visual disturbances. °3. Avoid touching the patch. Wash your hands with soap and water after contact with the patch. °  ° ° °

## 2016-02-08 NOTE — H&P (Signed)
Progress Notes Encounter Date: 01/18/2016 2:15 PM Courtney Kind, MD  Gynecology  Expand All Collapse All   [] Hide copied text Patient ID: Courtney Andersen, female   DOB: 1980-11-09, 35 y.o.   MRN: [id number] Preoperative History and Physical  Courtney Andersen is a 35 y.o. 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 No significant preoperative concerns. Pt states that she followed up with her appointment with Dr. Kerin Perna but was unable to have the procedure completed by the end of the year. Pt hasn't tried any medications for the relief of her symptoms. Pt denies any other symptoms.    Proposed surgery: myomectomy      Past Medical History:  Diagnosis Date  . Abnormal Pap smear   . Anemia   . Fibroids 11/24/2012  . Menorrhagia 11/24/2012  . Other and unspecified ovarian cyst 11/24/2012        Past Surgical History:  Procedure Laterality Date  . COLPOSCOPY W/ BIOPSY / CURETTAGE    . LEEP            OB History  Gravida Para Term Preterm AB Living  0 0 0 0 0 0  SAB TAB Ectopic Multiple Live Births  0 0 0 0 0      Patient denies any other pertinent gynecologic issues.         Current Outpatient Prescriptions on File Prior to Visit  Medication Sig Dispense Refill  . megestrol (MEGACE) 40 MG tablet Take 1 tablet (40 mg total) by mouth 3 (three) times daily. Until bleeding stops completely, then d/c 45 tablet 3  . norgestimate-ethinyl estradiol (MONO-LINYAH) 0.25-35 MG-MCG tablet Take 1 tablet by mouth daily. 1 Package 11   No current facility-administered medications on file prior to visit.    No Known Allergies  Social History:   reports that she is a non-smoker but has been exposed to tobacco smoke. She has never used smokeless tobacco. She reports that she drinks alcohol. She reports that she does not use drugs.       Family History  Problem Relation Age of Onset  . Hypertension Mother   . Diabetes  Father   . Hypertension Father   . Diabetes Brother   . Cancer Paternal Grandmother   . Cancer Paternal Aunt     Review of Systems: Noncontributory  PHYSICAL EXAM: Blood pressure (!) 150/80, pulse 95, height 5\' 7"  (1.702 m), weight 202 lb 9.6 oz (91.9 kg), last menstrual period 10/01/2015. General appearance - alert, well appearing, and in no distress Chest - clear to auscultation, no wheezes, rales or rhonchi, symmetric air entry Heart - normal rate and regular rhythm Abdomen - soft, nontender, nondistended, no masses or organomegaly Pelvic  VULVA: normal appearing vulva with no masses, tenderness or lesions,  VAGINA: normal appearing vagina with normal color, no lesions, moderate amount of brown discharge CERVIX: normal appearing cervix without discharge or lesions, everted s/p LEEP UTERUS: uterus is normal shape, consistency and nontender, anteverted, enlarged to 10 week's size, anteverted with posterior fibroid.  Extremities - peripheral pulses normal, no pedal edema, no clubbing or cyanosis  Discussion: 1. Discussed with pt risks and benefits of myomectomy via hysteroscopic resection  At end of discussion, pt had opportunity to ask questions and has no further questions at this time.   Specific discussion of myomectomy via hysteroscopic resection as noted above. Greater than 50% was spent in counseling and coordination of care with the patient.  We have agreed to  hysteroscopic resection of myoma, to be done at [facility].  Total time greater than: 15 minutes.   Labs: No results found for this or any previous visit (from the past 336 hour(s)).  Imaging Studies: ImagingResults  No results found.    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 Abnormal uterine bleeding (AUB) 09/26/2015  . Anemia 09/25/2015  . Menorrhagia 11/24/2012  . Fibroids submucous  11/24/2012  . Other and unspecified ovarian cyst 11/24/2012  . History of abnormal Pap smear 07/09/2012    Plan: Patient will undergo surgical management with myomectomy via hysteroscopic resection that will be schedule after 02/06/16.   Courtney Kind, MD 01/18/2016 2:43 PM

## 2016-02-08 NOTE — Op Note (Signed)
02/08/2016  2:24 PM  PATIENT:  Courtney Andersen  35 y.o. female  PRE-OPERATIVE DIAGNOSIS:  submucous myoma  POST-OPERATIVE DIAGNOSIS:  SUBMUCOUS MYOMA  PROCEDURE:  Procedure(s): HYSTEROSCOPY RESECTION OF MYOMA  WITH MYOSURE (N/A)  SURGEON:  Surgeon(s) and Role:    * Jonnie Kind, MD - Primary  PHYSICIAN ASSISTANT:   ASSISTANTS: none   ANESTHESIA:   local, general and paracervical block  EBL:  Total I/O In: 800 [I.V.:800] Out: 25 [Urine:20; Blood:5]  BLOOD ADMINISTERED:none  DRAINS: none   LOCAL MEDICATIONS USED:  MARCAINE    and Amount: 19 ml  SPECIMEN:  Source of Specimen:  myoma  DISPOSITION OF SPECIMEN:  PATHOLOGY   COUNTS:  YES  TOURNIQUET:  * No tourniquets in log *  DICTATION: .Dragon Dictation  PLAN OF CARE: Discharge to home after PACU  PATIENT DISPOSITION:  PACU - hemodynamically stable.   Delay start of Pharmacological VTE agent (>24hrs) due to surgical blood loss or risk of bleeding: not applicable Notes of procedure: Patient was prepped and draped with legs in low lithotomy support. Timeout was conducted. Ancef was administered. Operative procedure was confirmed by surgical team. The cervix was grasped with single-tooth tenaculum and sounded to 12 cm in uterine length in a very anteflexed position. Paracervical block had been applied 20 cc Marcaine with epinephrine. Bimanual exam had revealed the uterus to be enlarged by a posterior sub-serosal fibroid. The hysteroscope was inserted and was able to visualize the myoma in the endometrial cavity or originating from a small posterior stalk the Mosher device was prepared activated and Myosure resection of the large fibroid then proceeded over 1 minute 11 seconds sequence with excellent high resection of the myoma down to the flat surface of the rest of the endometrial cavity. Photo documentation was taken after removal showed the normal-appearing endometrial cavity above the resection. Bleeding was  minimal. Patient was then allowed to go recovery room in stable condition and will be discharged later today on Toradol with some Tylenol 3 l for breakthrough pain

## 2016-02-08 NOTE — Transfer of Care (Signed)
Immediate Anesthesia Transfer of Care Note  Patient: Courtney Andersen  Procedure(s) Performed: Procedure(s): HYSTEROSCOPY RESECTION OF MYOMA  WITH MYOSURE (N/A)  Patient Location: PACU  Anesthesia Type:General  Level of Consciousness:  sedated, patient cooperative and responds to stimulation  Airway & Oxygen Therapy:Patient Spontanous Breathing and Patient connected to face mask oxgen  Post-op Assessment:  Report given to PACU RN and Post -op Vital signs reviewed and stable  Post vital signs:  Reviewed and stable  Last Vitals:  Vitals:   02/08/16 1245  BP: 135/79  Pulse: 80  Resp: 18  Temp: [id number] C    Complications: No apparent anesthesia complications

## 2016-02-08 NOTE — Anesthesia Preprocedure Evaluation (Signed)
Anesthesia Evaluation  Patient identified by MRN, date of birth, ID band Patient awake    Reviewed: Allergy & Precautions, NPO status , Patient's Chart, lab work & pertinent test results  Airway Mallampati: I  TM Distance: >3 FB Neck ROM: Full    Dental  (+) Teeth Intact, Dental Advisory Given   Pulmonary    breath sounds clear to auscultation       Cardiovascular negative cardio ROS   Rhythm:Regular Rate:Normal     Neuro/Psych negative neurological ROS  negative psych ROS   GI/Hepatic negative GI ROS, Neg liver ROS,   Endo/Other  negative endocrine ROS  Renal/GU negative Renal ROS  negative genitourinary   Musculoskeletal negative musculoskeletal ROS (+)   Abdominal   Peds negative pediatric ROS (+)  Hematology negative hematology ROS (+)   Anesthesia Other Findings   Reproductive/Obstetrics negative OB ROS                             Anesthesia Physical Anesthesia Plan  ASA: II  Anesthesia Plan: General   Post-op Pain Management:    Induction: Intravenous  Airway Management Planned: LMA  Additional Equipment:   Intra-op Plan:   Post-operative Plan: Extubation in OR  Informed Consent: I have reviewed the patients History and Physical, chart, labs and discussed the procedure including the risks, benefits and alternatives for the proposed anesthesia with the patient or authorized representative who has indicated his/her understanding and acceptance.   Dental advisory given  Plan Discussed with: CRNA  Anesthesia Plan Comments:         Anesthesia Quick Evaluation

## 2016-02-08 NOTE — Brief Op Note (Signed)
02/08/2016  2:24 PM  PATIENT:  Courtney Andersen  35 y.o. female  PRE-OPERATIVE DIAGNOSIS:  submucous myoma  POST-OPERATIVE DIAGNOSIS:  SUBMUCOUS MYOMA  PROCEDURE:  Procedure(s): HYSTEROSCOPY RESECTION OF MYOMA  WITH MYOSURE (N/A)  SURGEON:  Surgeon(s) and Role:    * Jonnie Kind, MD - Primary  PHYSICIAN ASSISTANT:   ASSISTANTS: none   ANESTHESIA:   local, general and paracervical block  EBL:  Total I/O In: 800 [I.V.:800] Out: 25 [Urine:20; Blood:5]  BLOOD ADMINISTERED:none  DRAINS: none   LOCAL MEDICATIONS USED:  MARCAINE    and Amount: 19 ml  SPECIMEN:  Source of Specimen:  myoma  DISPOSITION OF SPECIMEN:  PATHOLOGY  COUNTS:  YES  TOURNIQUET:  * No tourniquets in log *  DICTATION: .Dragon Dictation  PLAN OF CARE: Discharge to home after PACU  PATIENT DISPOSITION:  PACU - hemodynamically stable.   Delay start of Pharmacological VTE agent (>24hrs) due to surgical blood loss or risk of bleeding: not applicable

## 2016-02-08 NOTE — Anesthesia Procedure Notes (Signed)
Procedure Name: LMA Insertion Date/Time: 02/08/2016 1:39 PM Performed by: Talbot Grumbling Pre-anesthesia Checklist: Patient identified, Emergency Drugs available, Suction available and Patient being monitored Patient Re-evaluated:Patient Re-evaluated prior to inductionOxygen Delivery Method: Circle system utilized Preoxygenation: Pre-oxygenation with 100% oxygen Intubation Type: IV induction Ventilation: Mask ventilation without difficulty LMA: LMA inserted LMA Size: 4.0 Number of attempts: 1 Placement Confirmation: positive ETCO2 and breath sounds checked- equal and bilateral Tube secured with: Tape Dental Injury: Teeth and Oropharynx as per pre-operative assessment

## 2016-02-09 NOTE — Anesthesia Postprocedure Evaluation (Signed)
Anesthesia Post Note  Patient: Courtney Andersen  Procedure(s) Performed: Procedure(s) (LRB): HYSTEROSCOPY RESECTION OF MYOMA  WITH MYOSURE (N/A)  Patient location during evaluation: PACU Anesthesia Type: General Level of consciousness: awake and alert Pain management: pain level controlled Vital Signs Assessment: post-procedure vital signs reviewed and stable Respiratory status: spontaneous breathing, nonlabored ventilation, respiratory function stable and patient connected to nasal cannula oxygen Cardiovascular status: blood pressure returned to baseline and stable Postop Assessment: no signs of nausea or vomiting Anesthetic complications: no    Last Vitals:  Vitals:   02/08/16 1515 02/08/16 1602  BP: 109/71 124/73  Pulse: 67 61  Resp: 16 18  Temp: 37.1 C     Last Pain:  Vitals:   02/08/16 1602  TempSrc:   PainSc: 0-No pain                 Effie Berkshire

## 2016-02-10 ENCOUNTER — Encounter (HOSPITAL_COMMUNITY): Payer: Self-pay | Admitting: Obstetrics and Gynecology

## 2016-02-16 ENCOUNTER — Encounter: Payer: Self-pay | Admitting: Obstetrics and Gynecology

## 2016-02-16 ENCOUNTER — Ambulatory Visit (INDEPENDENT_AMBULATORY_CARE_PROVIDER_SITE_OTHER): Payer: PRIVATE HEALTH INSURANCE | Admitting: Obstetrics and Gynecology

## 2016-02-16 VITALS — BP 130/80 | HR 67 | Wt 200.6 lb

## 2016-02-16 DIAGNOSIS — Z9889 Other specified postprocedural states: Secondary | ICD-10-CM | POA: Diagnosis not present

## 2016-02-16 DIAGNOSIS — Z09 Encounter for follow-up examination after completed treatment for conditions other than malignant neoplasm: Secondary | ICD-10-CM

## 2016-02-16 NOTE — Patient Instructions (Signed)
Hysteroscopy °Hysteroscopy is a procedure used for looking inside the womb (uterus). It may be done for various reasons, including: °· To evaluate abnormal bleeding, fibroid (benign, noncancerous) tumors, polyps, scar tissue (adhesions), and possibly cancer of the uterus. °· To look for lumps (tumors) and other uterine growths. °· To look for causes of why a woman cannot get pregnant (infertility), causes of recurrent loss of pregnancy (miscarriages), or a lost intrauterine device (IUD). °· To perform a sterilization by blocking the fallopian tubes from inside the uterus. °In this procedure, a thin, flexible tube with a tiny light and camera on the end of it (hysteroscope) is used to look inside the uterus. A hysteroscopy should be done right after a menstrual period to be sure you are not pregnant. °LET YOUR HEALTH CARE PROVIDER KNOW ABOUT:  °· Any allergies you have. °· All medicines you are taking, including vitamins, herbs, eye drops, creams, and over-the-counter medicines. °· Previous problems you or members of your family have had with the use of anesthetics. °· Any blood disorders you have. °· Previous surgeries you have had. °· Medical conditions you have. °RISKS AND COMPLICATIONS  °Generally, this is a safe procedure. However, as with any procedure, complications can occur. Possible complications include: °· Putting a hole in the uterus. °· Excessive bleeding. °· Infection. °· Damage to the cervix. °· Injury to other organs. °· Allergic reaction to medicines. °· Too much fluid used in the uterus for the procedure. °BEFORE THE PROCEDURE  °· Ask your health care provider about changing or stopping any regular medicines. °· Do not take aspirin or blood thinners for 1 week before the procedure, or as directed by your health care provider. These can cause bleeding. °· If you smoke, do not smoke for 2 weeks before the procedure. °· In some cases, a medicine is placed in the cervix the day before the procedure.  This medicine makes the cervix have a larger opening (dilate). This makes it easier for the instrument to be inserted into the uterus during the procedure. °· Do not eat or drink anything for at least 8 hours before the surgery. °· Arrange for someone to take you home after the procedure. °PROCEDURE  °· You may be given a medicine to relax you (sedative). You may also be given one of the following: °¨ A medicine that numbs the area around the cervix (local anesthetic). °¨ A medicine that makes you sleep through the procedure (general anesthetic). °· The hysteroscope is inserted through the vagina into the uterus. The camera on the hysteroscope sends a picture to a TV screen. This gives the surgeon a good view inside the uterus. °· During the procedure, air or a liquid is put into the uterus, which allows the surgeon to see better. °· Sometimes, tissue is gently scraped from inside the uterus. These tissue samples are sent to a lab for testing. °AFTER THE PROCEDURE  °· If you had a general anesthetic, you may be groggy for a couple hours after the procedure. °· If you had a local anesthetic, you will be able to go home as soon as you are stable and feel ready. °· You may have some cramping. This normally lasts for a couple days. °· You may have bleeding, which varies from light spotting for a few days to menstrual-like bleeding for 3-7 days. This is normal. °· If your test results are not back during the visit, make an appointment with your health care provider to find out the   results. °This information is not intended to replace advice given to you by your health care provider. Make sure you discuss any questions you have with your health care provider. °Document Released: 05/27/2000 Document Revised: 12/09/2012 Document Reviewed: 09/17/2012 °Elsevier Interactive Patient Education © 2017 [employer] ° °

## 2016-02-16 NOTE — Progress Notes (Signed)
   Subjective:  Courtney Andersen is a 35 y.o. female now 1 weeks status post operative hysteroscopy.    no complaints. Feels fine, had bleeding last 3 days, now resolved Review of Systems Negative except none   Diet:   reg   Bowel movements : normal.  The patient is not having any pain.  Objective:  BP 130/80 (BP Location: Right Arm, Patient Position: Sitting, Cuff Size: Normal)   Pulse 67   Wt 200 lb 9.6 oz (91 kg)   LMP 10/01/2015   BMI 31.42 kg/m  General:Well developed, well nourished.  No acute distress. Abdomen: Bowel sounds normal, soft, non-tender. Pelvic Exam:    External Genitalia:  Normal.    Vagina: Normal    Cervix: Normal     Incision(s):  Not applicable  Assessment:  Post-Op 1 weeks s/p operative hysteroscopy   resume OCP's after next spont menses in approx 1 month  postoperatively.   Plan:   2. . current medications.no change, restart ocp in 1 month 3. Activity restrictions: none 4. return to work: now. 5. Follow up in prn .

## 2016-08-21 ENCOUNTER — Encounter: Payer: Self-pay | Admitting: Obstetrics and Gynecology

## 2016-08-21 ENCOUNTER — Other Ambulatory Visit (HOSPITAL_COMMUNITY)
Admission: RE | Admit: 2016-08-21 | Discharge: 2016-08-21 | Disposition: A | Discharge status: Home / Self Care | Payer: Managed Care, Other (non HMO) | Source: Ambulatory Visit | Attending: Obstetrics and Gynecology | Admitting: Obstetrics and Gynecology

## 2016-08-21 ENCOUNTER — Ambulatory Visit (INDEPENDENT_AMBULATORY_CARE_PROVIDER_SITE_OTHER): Payer: Managed Care, Other (non HMO) | Admitting: Obstetrics and Gynecology

## 2016-08-21 VITALS — BP 100/60 | HR 78 | Ht 67.0 in | Wt 190.6 lb

## 2016-08-21 DIAGNOSIS — Z01419 Encounter for gynecological examination (general) (routine) without abnormal findings: Secondary | ICD-10-CM

## 2016-08-21 NOTE — Progress Notes (Addendum)
Patient ID: Courtney Andersen, female   DOB: Sep 21, 1980, 36 y.o.   MRN: [id number]  Assessment:  Annual Gyn Exam   Plan:  1. pap smear done, next pap due in 1 year DUE TO HX OF LEEP/LASER OF CERVIX 2. return annually or prn 3    Annual mammogram advised Subjective:   Chief Complaint  Patient presents with  . Gynecologic Exam    pap/physcial     Courtney Andersen is a 36 y.o. female [place] who presents for annual exam. Patient's last menstrual period was 08/01/2016. The patient has no complaints today. She states her periods are light and much more tolerable since removal of fibroid.   The following portions of the patient's history were reviewed and updated as appropriate: allergies, current medications, past family history, past medical history, past social history, past surgical history and problem list. Past Medical History:  Diagnosis Date  . Abnormal Pap smear   . Anemia   . Fibroids 11/24/2012  . History of blood transfusion 09/2014   3 units [facility]  . Hx of blood transfusion reaction 09/2015   x 3 units [facility]  . Menorrhagia 11/24/2012  . Other and unspecified ovarian cyst 11/24/2012    Past Surgical History:  Procedure Laterality Date  . COLPOSCOPY W/ BIOPSY / CURETTAGE    . DILATATION & CURETTAGE/HYSTEROSCOPY WITH MYOSURE N/A 02/08/2016   Procedure: HYSTEROSCOPY RESECTION OF MYOMA  WITH MYOSURE;  Surgeon: Jonnie Kind, MD;  Location: [facility];  Service: Gynecology;  Laterality: N/A;  . LEEP    . MYOMECTOMY    . WISDOM TOOTH EXTRACTION       Current Outpatient Prescriptions:  .  norgestimate-ethinyl estradiol (ORTHO-CYCLEN,SPRINTEC,PREVIFEM) 0.25-35 MG-MCG tablet, Take 1 tablet by mouth daily., Disp: , Rfl:   Review of Systems Constitutional: negative Gastrointestinal: negative Genitourinary: negative   Objective:  BP 100/60   Pulse 78   Ht 5\' 7"  (1.702 m)   Wt 190 lb 9.6 oz (86.5 kg)   LMP 08/01/2016   BMI 29.85 kg/m    BMI: Body mass  index is 29.85 kg/m.  General Appearance: Alert, appropriate appearance for age. No acute distress HEENT: Grossly normal Neck / Thyroid:  Cardiovascular: RRR; normal S1, S2, no murmur Lungs: CTA bilaterally Back: No CVAT Breast Exam: No masses or nodes.No dimpling, nipple retraction or discharge. Gastrointestinal: Soft, non-tender, no masses or organomegaly Pelvic Exam:  External genitalia: normal general appearance Vaginal: normal mucosa without prolapse or lesions Cervix: normal appearance and standard PAP obtained, s/p laser tx  Adnexa: normal bimanual exam Uterus: normal single, nontender, tiny, small  Rectovaginal: not indicated Lymphatic Exam: Non-palpable nodes in neck, clavicular, axillary, or inguinal regions  Skin: no rash or abnormalities Neurologic: Normal gait and speech, no tremor  Psychiatric: Alert and oriented, appropriate affect.  Urinalysis:Not done  Mallory Shirk. MD Pgr [phone] 11:50 AM    By signing my name below, I, Hansel Feinstein, attest that this documentation has been prepared under the direction and in the presence of Jonnie Kind, MD. Electronically Signed: Hansel Feinstein, ED Scribe. 08/21/16. 11:45 AM.  I personally performed the services described in this documentation, which was SCRIBED in my presence. The recorded information has been reviewed and considered accurate. It has been edited as necessary during review. Jonnie Kind, MD

## 2016-08-26 LAB — CYTOLOGY - PAP
DIAGNOSIS: NEGATIVE
HPV (WINDOPATH): NOT DETECTED

## 2016-08-28 ENCOUNTER — Other Ambulatory Visit: Payer: Self-pay | Admitting: Obstetrics and Gynecology

## 2016-09-07 IMAGING — US US TRANSVAGINAL NON-OB
1 series · 13 of 25 positions shown · non-contrast
Comparison: [DATE] x 4.2 x 5.4 cm.

CLINICAL DATA: Three weeks of abnormal uterine bleeding,
menorrhagia, anemia, history of fibroids on prior ultrasound

EXAM:
TRANSABDOMINAL AND TRANSVAGINAL ULTRASOUND OF PELVIS
TECHNIQUE: Both transabdominal and transvaginal ultrasound examinations of the
pelvis were performed. Transabdominal technique was performed for
global imaging of the pelvis including uterus, ovaries, adnexal
regions, and pelvic cul-de-sac. It was necessary to proceed with
endovaginal exam following the transabdominal exam to visualize the
uterus, endometrium, ovaries, and adnexal regions.

[Series 1: us transvaginal non-ob · 0.22mm/px · 13 of 131 slices shown]
[im 1/131]
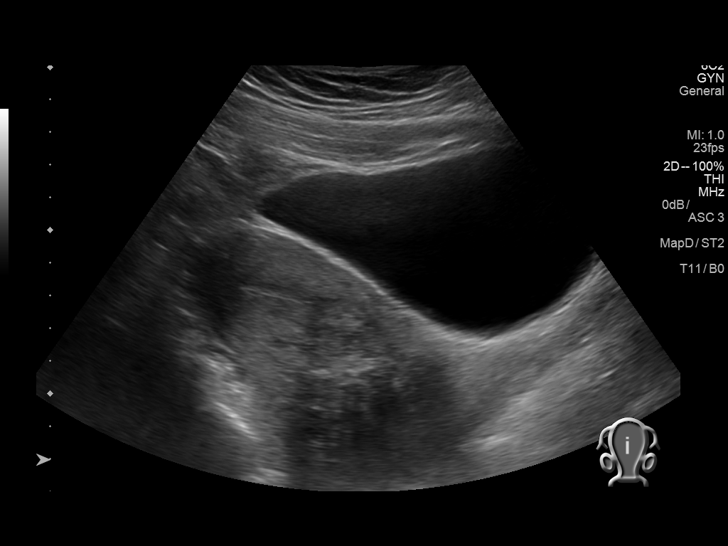
[im 11/131]
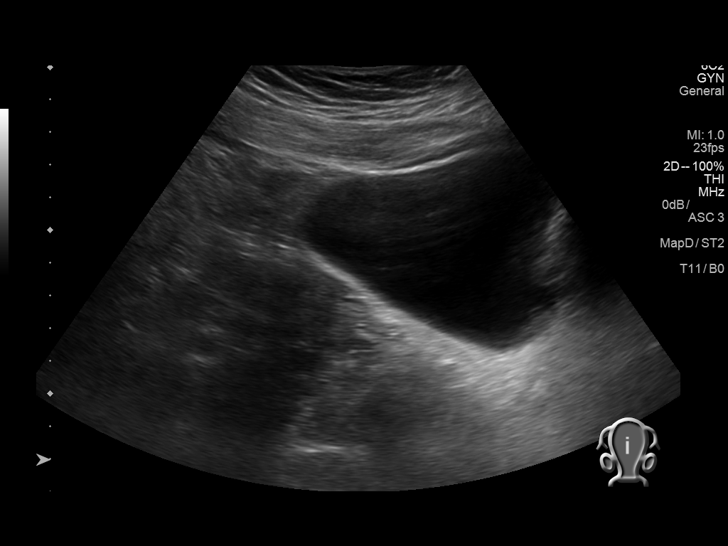
[im 22/131]
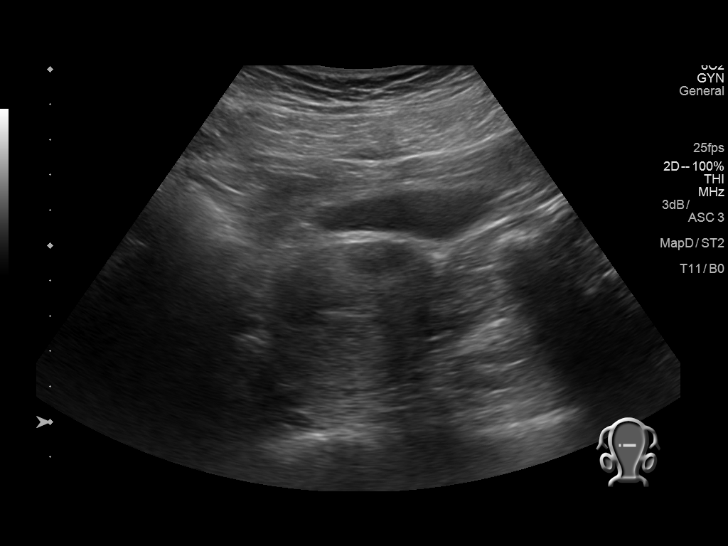
[im 33/131]
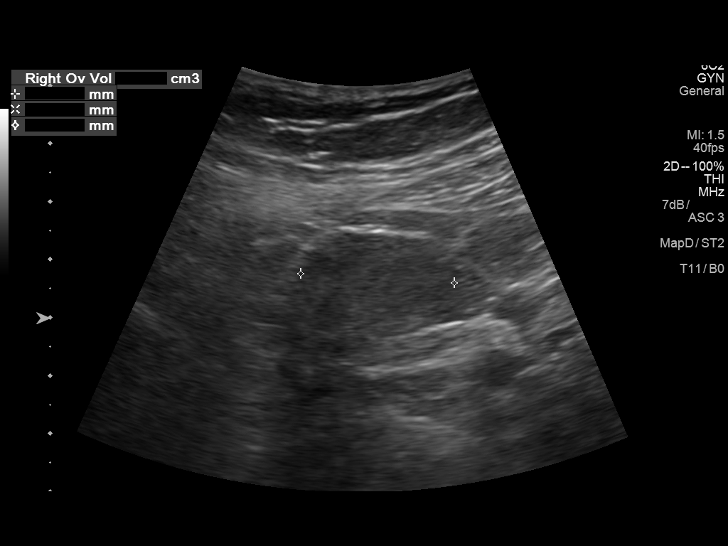
[im 44/131]
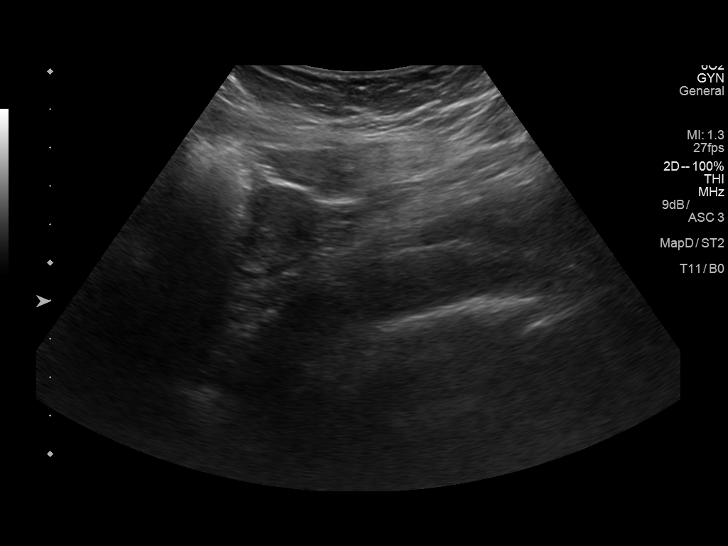
[im 55/131]
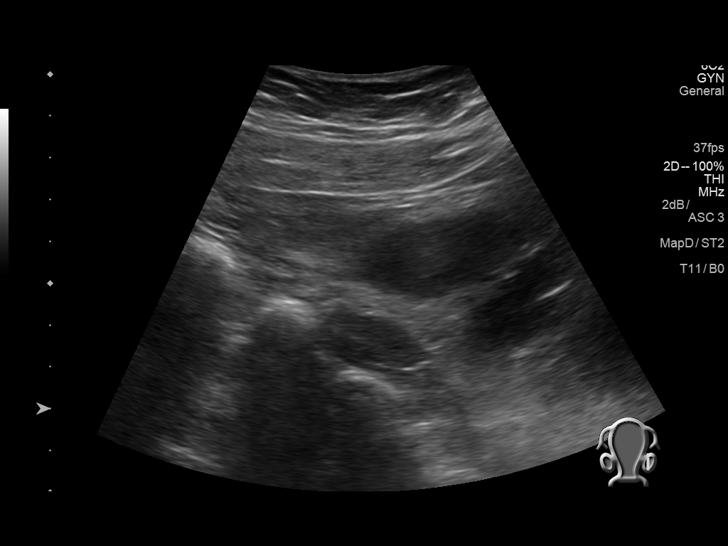
[im 66/131]
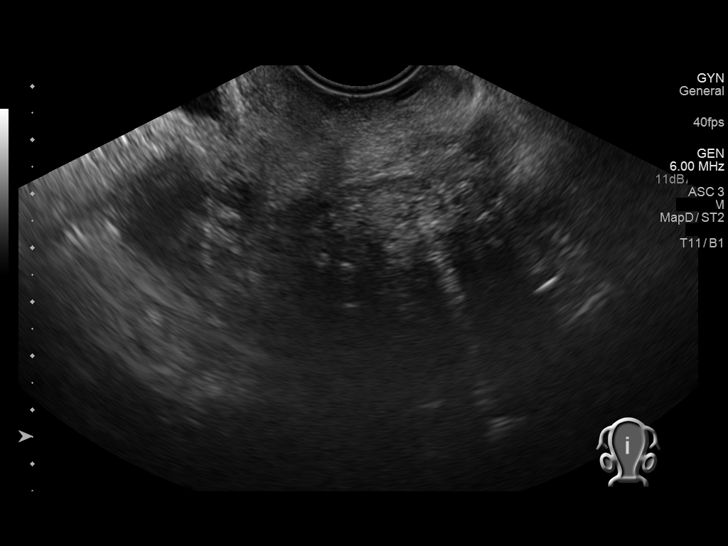
[im 76/131]
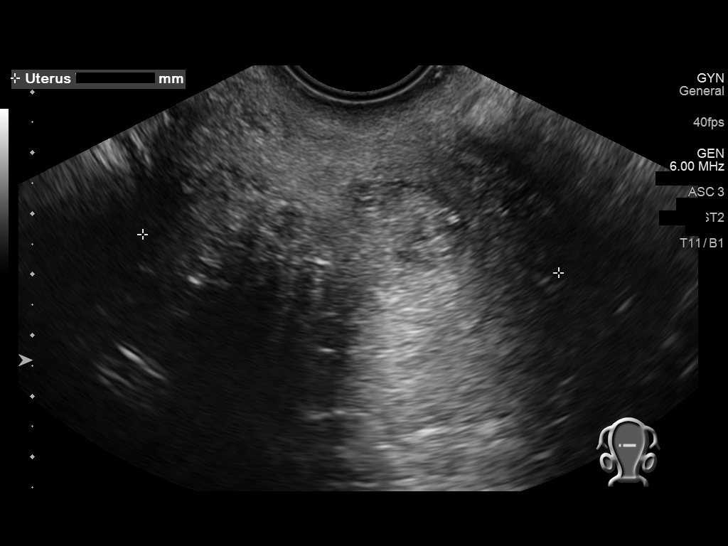
[im 87/131]
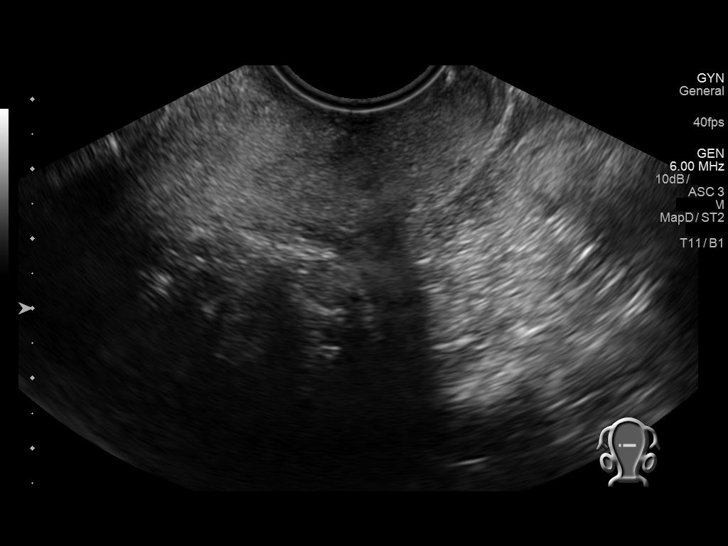
[im 98/131]
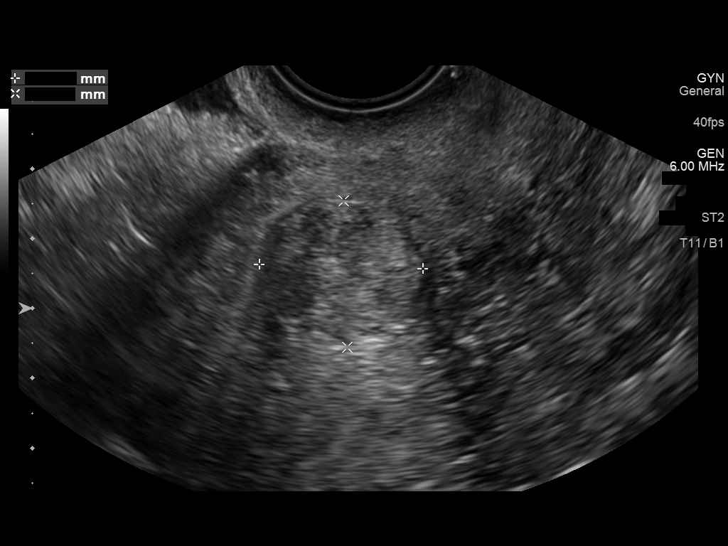
[im 109/131]
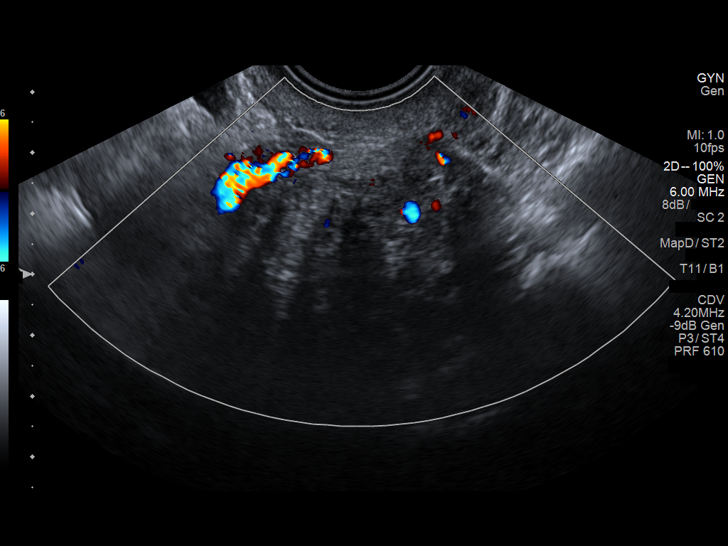
[im 120/131]
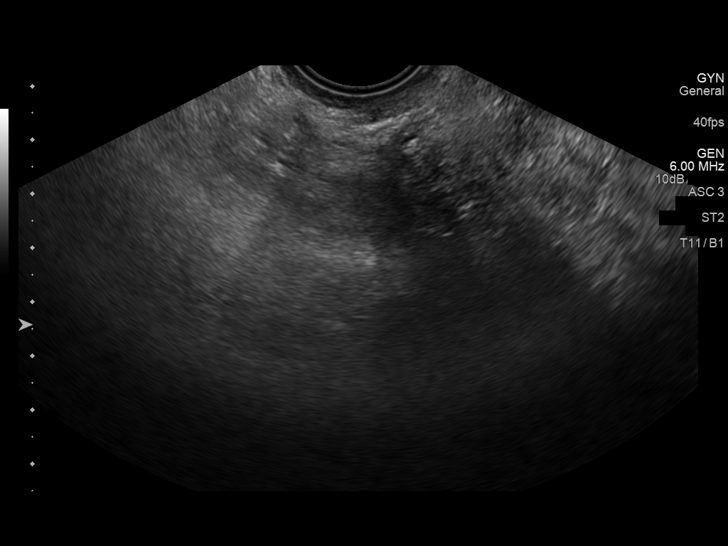
[im 131/131]
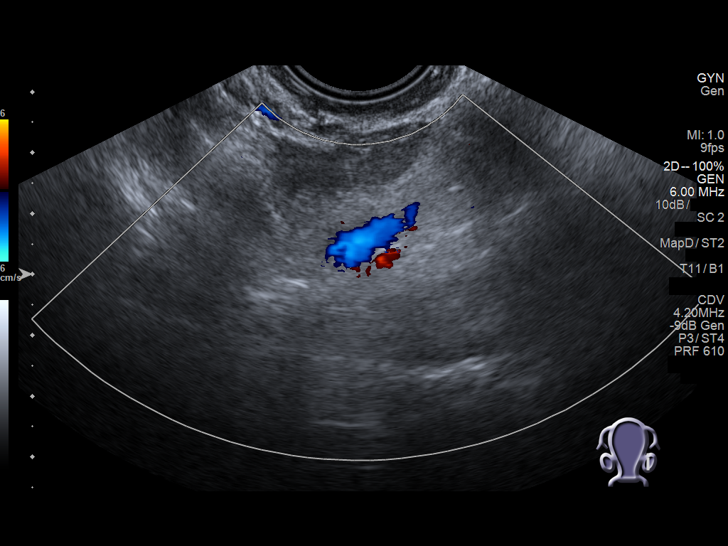

[13 of 25 positions shown; findings below may reference images not displayed]

FINDINGS: Uterus

Measurements: 10.0 x 4.2 x 5.4 cm .In the right aspect of the
uterine corpus there is a 4.5 x 3.1 x 4.9 cm hypoechoic focus with
distal shadowing most compatible with a fibroid. A second lower
corpus fibroid is suspected anteriorly measuring 2.3 x 2.1 x
cm..

Endometrium

Thickness: 5.1.  No focal abnormality visualized.

Right ovary

Measurements: 2.2 x 1.7 x 2.7 cm. Visualization is limited due to
bowel gas. The echotexture is normal.

Left ovary

Measurements: 2.5 x 1.2 x 2.4 cm. Visualization is limited due to
bowel gas. The echotexture is normal.

Other findings

None
IMPRESSION: 1. Two uterine fibroids with the largest measuring 4.9 cm. Normal
endometrial stripe thickening. If bleeding remains unresponsive to
hormonal or medical therapy, sonohysterogram should be considered
for focal lesion work-up. (Ref: Radiological Reasoning: Algorithmic
Workup of Abnormal Vaginal Bleeding with Endovaginal Sonography and
Sonohysterography. AJR 0554; 191:S68-73)
2. Somewhat limited visualization of the ovaries. There are grossly
normal in size and echotexture.
3. No free pelvic fluid.

## 2017-07-18 ENCOUNTER — Other Ambulatory Visit: Payer: Self-pay | Admitting: Obstetrics and Gynecology

## 2017-07-18 NOTE — Telephone Encounter (Signed)
refil Rx.

## 2017-08-22 ENCOUNTER — Other Ambulatory Visit: Payer: Managed Care, Other (non HMO) | Admitting: Obstetrics and Gynecology

## 2017-08-22 ENCOUNTER — Other Ambulatory Visit: Payer: Self-pay

## 2017-08-22 ENCOUNTER — Ambulatory Visit (INDEPENDENT_AMBULATORY_CARE_PROVIDER_SITE_OTHER): Payer: Managed Care, Other (non HMO) | Admitting: Obstetrics & Gynecology

## 2017-08-22 ENCOUNTER — Encounter: Payer: Self-pay | Admitting: Obstetrics & Gynecology

## 2017-08-22 ENCOUNTER — Other Ambulatory Visit (HOSPITAL_COMMUNITY)
Admission: RE | Admit: 2017-08-22 | Discharge: 2017-08-22 | Disposition: A | Discharge status: Home / Self Care | Payer: Managed Care, Other (non HMO) | Source: Ambulatory Visit | Attending: Obstetrics and Gynecology | Admitting: Obstetrics and Gynecology

## 2017-08-22 VITALS — BP 121/81 | HR 76 | Resp 18 | Ht 67.0 in | Wt 195.0 lb

## 2017-08-22 DIAGNOSIS — Z01419 Encounter for gynecological examination (general) (routine) without abnormal findings: Secondary | ICD-10-CM | POA: Diagnosis not present

## 2017-08-22 DIAGNOSIS — Z7722 Contact with and (suspected) exposure to environmental tobacco smoke (acute) (chronic): Secondary | ICD-10-CM | POA: Diagnosis not present

## 2017-08-22 NOTE — Progress Notes (Signed)
Subjective:     Courtney Andersen is a 37 y.o. female here for a routine exam.  Patient's last menstrual period was 07/24/2017. G0P0000 Birth Control Method:  OCP Menstrual Calendar(currently): regular  Current complaints: none.   Current acute medical issues:  none   Recent Gynecologic History Patient's last menstrual period was 07/24/2017. Last Pap: 2018,  normal Last mammogram: na,    Past Medical History:  Diagnosis Date  . Abnormal Pap smear   . Anemia   . Fibroids 11/24/2012  . History of blood transfusion 09/2014   3 units transf[facility] at [facility]  . Hx of blood transfusion reaction 09/2015   x 3 units [facility]  . Menorrhagia 11/24/2012  . Other and unspecified ovarian cyst 11/24/2012    Past Surgical History:  Procedure Laterality Date  . COLPOSCOPY W/ BIOPSY / CURETTAGE    . DILATATION & CURETTAGE/HYSTEROSCOPY WITH MYOSURE N/A 02/08/2016   Procedure: HYSTEROSCOPY RESECTION OF MYOMA  WITH MYOSURE;  Surgeon: Jonnie Kind, MD;  Location: [facility];  Service: Gynecology;  Laterality: N/A;  . LEEP    . MYOMECTOMY    . WISDOM TOOTH EXTRACTION      OB History    Gravida  0   Para  0   Term  0   Preterm  0   AB  0   Living  0     SAB  0   TAB  0   Ectopic  0   Multiple  0   Live Births  0           Social History   Socioeconomic History  . Marital status: Single    Spouse name: Not on file  . Number of children: Not on file  . Years of education: Not on file  . Highest education level: Not on file  Occupational History  . Not on file  Social Needs  . Financial resource strain: Not on file  . Food insecurity:    Worry: Not on file    Inability: Not on file  . Transportation needs:    Medical: Not on file    Non-medical: Not on file  Tobacco Use  . Smoking status: Passive Smoke Exposure - Never Smoker  . Smokeless tobacco: Never Used  Substance and Sexual Activity  . Alcohol use: Yes    Comment: wine weekends  . Drug use: No  .  Sexual activity: Yes    Partners: Male    Birth control/protection: Pill  Lifestyle  . Physical activity:    Days per week: Not on file    Minutes per session: Not on file  . Stress: Not on file  Relationships  . Social connections:    Talks on phone: Not on file    Gets together: Not on file    Attends religious service: Not on file    Active member of club or organization: Not on file    Attends meetings of clubs or organizations: Not on file    Relationship status: Not on file  Other Topics Concern  . Not on file  Social History Narrative  . Not on file    Family History  Problem Relation Age of Onset  . Hypertension Mother   . Diabetes Father   . Hypertension Father   . Diabetes Brother   . Cancer Paternal Grandmother   . Cancer Paternal Aunt   . Breast cancer Maternal Aunt      Current Outpatient Medications:  .  MILI 0.25-35 MG-MCG tablet, TAKE 1 TABLET BY MOUTH ONCE DAILY, Disp: 28 tablet, Rfl: 11  Review of Systems  Review of Systems  Constitutional: Negative for fever, chills, weight loss, malaise/fatigue and diaphoresis.  HENT: Negative for hearing loss, ear pain, nosebleeds, congestion, sore throat, neck pain, tinnitus and ear discharge.   Eyes: Negative for blurred vision, double vision, photophobia, pain, discharge and redness.  Respiratory: Negative for cough, hemoptysis, sputum production, shortness of breath, wheezing and stridor.   Cardiovascular: Negative for chest pain, palpitations, orthopnea, claudication, leg swelling and PND.  Gastrointestinal: negative for abdominal pain. Negative for heartburn, nausea, vomiting, diarrhea, constipation, blood in stool and melena.  Genitourinary: Negative for dysuria, urgency, frequency, hematuria and flank pain.  Musculoskeletal: Negative for myalgias, back pain, joint pain and falls.  Skin: Negative for itching and rash.  Neurological: Negative for dizziness, tingling, tremors, sensory change, speech change,  focal weakness, seizures, loss of consciousness, weakness and headaches.  Endo/Heme/Allergies: Negative for environmental allergies and polydipsia. Does not bruise/bleed easily.  Psychiatric/Behavioral: Negative for depression, suicidal ideas, hallucinations, memory loss and substance abuse. The patient is not nervous/anxious and does not have insomnia.        Objective:  Blood pressure 121/81, pulse 76, resp. rate 18, height 5\' 7"  (1.702 m), weight 195 lb (88.5 kg), last menstrual period 07/24/2017.   Physical Exam  Vitals reviewed. Constitutional: She is oriented to person, place, and time. She appears well-developed and well-nourished.  HENT:  Head: Normocephalic and atraumatic.        Right Ear: External ear normal.  Left Ear: External ear normal.  Nose: Nose normal.  Mouth/Throat: Oropharynx is clear and moist.  Eyes: Conjunctivae and EOM are normal. Pupils are equal, round, and reactive to light. Right eye exhibits no discharge. Left eye exhibits no discharge. No scleral icterus.  Neck: Normal range of motion. Neck supple. No tracheal deviation present. No thyromegaly present.  Cardiovascular: Normal rate, regular rhythm, normal heart sounds and intact distal pulses.  Exam reveals no gallop and no friction rub.   No murmur heard. Respiratory: Effort normal and breath sounds normal. No respiratory distress. She has no wheezes. She has no rales. She exhibits no tenderness.  GI: Soft. Bowel sounds are normal. She exhibits no distension and no mass. There is no tenderness. There is no rebound and no guarding.  Genitourinary:  Breasts no masses skin changes or nipple changes bilaterally      Vulva is normal without lesions Vagina is pink moist without discharge Cervix normal in appearance and pap is done Uterus is normal size shape and contour Adnexa is negative with normal sized ovaries   Musculoskeletal: Normal range of motion. She exhibits no edema and no tenderness.   Neurological: She is alert and oriented to person, place, and time. She has normal reflexes. She displays normal reflexes. No cranial nerve deficit. She exhibits normal muscle tone. Coordination normal.  Skin: Skin is warm and dry. No rash noted. No erythema. No pallor.  Psychiatric: She has a normal mood and affect. Her behavior is normal. Judgment and thought content normal.       Medications Ordered at today's visit: No orders of the defined types were placed in this encounter.   Other orders placed at today's visit: No orders of the defined types were placed in this encounter.     Assessment:    Healthy female exam.    Plan:    Contraception: OCP (estrogen/progesterone). Follow up in: 1 year.  Return in about 1 year (around 08/23/2018) for yearly.

## 2017-08-26 LAB — CYTOLOGY - PAP
DIAGNOSIS: NEGATIVE
HPV: NOT DETECTED

## 2018-06-16 ENCOUNTER — Other Ambulatory Visit: Payer: Self-pay | Admitting: Obstetrics and Gynecology

## 2018-06-17 ENCOUNTER — Other Ambulatory Visit: Payer: Self-pay | Admitting: Obstetrics and Gynecology

## 2018-08-20 ENCOUNTER — Ambulatory Visit (INDEPENDENT_AMBULATORY_CARE_PROVIDER_SITE_OTHER): Payer: Managed Care, Other (non HMO) | Admitting: Adult Health

## 2018-08-20 ENCOUNTER — Encounter: Payer: Self-pay | Admitting: Adult Health

## 2018-08-20 ENCOUNTER — Other Ambulatory Visit: Payer: Self-pay

## 2018-08-20 VITALS — BP 123/79 | HR 76 | Ht 67.0 in | Wt 198.6 lb

## 2018-08-20 DIAGNOSIS — Z01419 Encounter for gynecological examination (general) (routine) without abnormal findings: Secondary | ICD-10-CM

## 2018-08-20 DIAGNOSIS — Z9889 Other specified postprocedural states: Secondary | ICD-10-CM

## 2018-08-20 DIAGNOSIS — Z3041 Encounter for surveillance of contraceptive pills: Secondary | ICD-10-CM

## 2018-08-20 MED ORDER — NORGESTIMATE-ETH ESTRADIOL 0.25-35 MG-MCG PO TABS: 1.0000 | ORAL_TABLET | Freq: Every day | ORAL | 4 refills | Status: DC

## 2018-08-20 NOTE — Progress Notes (Signed)
Patient ID: Courtney Andersen, female   DOB: Jun 17, 1980, 38 y.o.   MRN: [id number] History of Present Illness: Courtney Andersen is a 38 year old white female, married (11/2017), G0P0, in for a well woman gyn exam,she had a normal pap with negative HPV, 08/22/17. She is Social worker at [employer].  No PCP.  Current Medications, Allergies, Past Medical History, Past Surgical History, Family History and Social History were reviewed in [employer] record.     Review of Systems: Patient denies any headaches, hearing loss, fatigue, blurred vision, shortness of breath, chest pain, abdominal pain, problems with bowel movements, urination, or intercourse. No joint pain or mood swings. She is happy with her OCs, and may want children but has not talked with husband about that.     Physical Exam:BP 123/79 (BP Location: Right Arm, Patient Position: Sitting, Cuff Size: Normal)   Pulse 76   Ht 5\' 7"  (1.702 m)   Wt 198 lb 9.6 oz (90.1 kg)   LMP 07/27/2018   BMI 31.11 kg/m  General:  Well developed, well nourished, no acute distress Skin:  Warm and dry Neck:  Midline trachea, normal thyroid, good ROM, no lymphadenopathy Lungs; Clear to auscultation bilaterally Breast:  No dominant palpable mass, retraction, or nipple discharge Cardiovascular: Regular rate and rhythm Abdomen:  Soft, non tender, no hepatosplenomegaly Pelvic:  External genitalia is normal in appearance, no lesions.  The vagina is normal in appearance. Urethra has no lesions or masses. The cervix is bulbous.  Uterus is felt to be normal size, shape, and contour.  No adnexal masses or tenderness noted.Bladder is non tender, no masses felt. Extremities/musculoskeletal:  No swelling or varicosities noted, no clubbing or cyanosis Psych:  No mood changes, alert and cooperative,seems happy Fall risk is low. PHQ 9 score is 0. Examination chaperoned by Levy Pupa LPN  Impression: 1. Encounter for well woman exam with routine  gynecological exam   2. Encounter for surveillance of contraceptive pills   3.     History of abnormal pap, and sp LEEP    Plan: Meds ordered this encounter  Medications  . norgestimate-ethinyl estradiol (MILI) 0.25-35 MG-MCG tablet    Sig: Take 1 tablet by mouth daily.    Dispense:  84 tablet    Refill:  4    Order Specific Question:   Supervising Provider    Answer:   Tania Ade H [[id number]]  Check CBC,CMP,TSH and lipids Mammogram at 41 Physical in 1 year Pap in 2022

## 2019-05-18 ENCOUNTER — Other Ambulatory Visit: Payer: Self-pay

## 2019-05-18 ENCOUNTER — Ambulatory Visit: Payer: Managed Care, Other (non HMO) | Attending: Internal Medicine

## 2019-05-18 DIAGNOSIS — Z20822 Contact with and (suspected) exposure to covid-19: Secondary | ICD-10-CM

## 2019-05-19 LAB — NOVEL CORONAVIRUS, NAA: SARS-CoV-2, NAA: NOT DETECTED

## 2019-09-13 ENCOUNTER — Ambulatory Visit (INDEPENDENT_AMBULATORY_CARE_PROVIDER_SITE_OTHER): Payer: Managed Care, Other (non HMO) | Admitting: Advanced Practice Midwife

## 2019-09-13 ENCOUNTER — Encounter: Payer: Self-pay | Admitting: Advanced Practice Midwife

## 2019-09-13 VITALS — BP 118/72 | HR 64 | Ht 67.25 in | Wt 193.5 lb

## 2019-09-13 DIAGNOSIS — Z01419 Encounter for gynecological examination (general) (routine) without abnormal findings: Secondary | ICD-10-CM | POA: Diagnosis not present

## 2019-09-13 MED ORDER — NORGESTIMATE-ETH ESTRADIOL 0.25-35 MG-MCG PO TABS: 1.0000 | ORAL_TABLET | Freq: Every day | ORAL | 4 refills | Status: DC

## 2019-09-13 NOTE — Progress Notes (Signed)
WELL-WOMAN EXAMINATION Patient name: Courtney Andersen MRN [id number]  Date of birth: Nov 01, 1980 Chief Complaint:   Gynecologic Exam  History of Present Illness:   Courtney Andersen is a 39 y.o. G0P0000  female being seen today for a routine well-woman exam.  Current complaints: doing well  Depression screen [facility] 2/9 09/13/2019 08/20/2018 08/22/2017 08/21/2016  Decreased Interest 0 0 0 0  Down, Depressed, Hopeless 0 0 0 0  PHQ - 2 Score 0 0 0 0  Altered sleeping 0 0 - -  Tired, decreased energy 1 0 - -  Change in appetite 0 0 - -  Feeling bad or failure about yourself  0 0 - -  Trouble concentrating 0 0 - -  Moving slowly or fidgety/restless 0 0 - -  Suicidal thoughts 0 0 - -  PHQ-9 Score 1 0 - -  Difficult doing work/chores Not difficult at all Not difficult at all - -     PCP: none      does desire labs Patient's last menstrual period was 08/19/2019 (approximate). The current method of family planning is OCP (estrogen/progesterone).  Last pap June 2019. Results were: normal. H/O abnormal pap: yes , with LEEP 2013 Last mammogram: never. Has 3 non-first degree relatives with breast CA dx in late 62s Last colonoscopy: never. No fam hx colon CA. Review of Systems:   Pertinent items are noted in HPI Denies any headaches, blurred vision, fatigue, shortness of breath, chest pain, abdominal pain, abnormal vaginal discharge/itching/odor/irritation, problems with periods, bowel movements, urination, or intercourse unless otherwise stated above. Pertinent History Reviewed:  Reviewed past medical,surgical, social and family history.  Reviewed problem list, medications and allergies. Physical Assessment:   Vitals:   09/13/19 1445  BP: 118/72  Pulse: 64  Weight: 193 lb 8 oz (87.8 kg)  Height: 5' 7.25" (1.708 m)  Body mass index is 30.08 kg/m.        Physical Examination:   General appearance - well appearing, and in no distress  Mental status - alert, oriented to person, place, and  time  Psych:  She has a normal mood and affect  Skin - warm and dry, normal color, no suspicious lesions noted  Chest - effort normal, all lung fields clear to auscultation bilaterally  Heart - normal rate and regular rhythm  Neck:  midline trachea, no thyromegaly or nodules  Breasts - breasts appear normal, no suspicious masses, no skin or nipple changes or  axillary nodes  Abdomen - soft, nontender, nondistended, no masses or organomegaly  Pelvic - VULVA: normal appearing vulva with no masses, tenderness or lesions  VAGINA: normal appearing vagina with normal color and discharge, no lesions  CERVIX: normal appearing cervix without discharge or lesions, no CMT  Thin prep pap is not done   UTERUS: uterus is felt to be normal size, shape, consistency and nontender   ADNEXA: No adnexal masses or tenderness noted.  Extremities:  No swelling or varicosities noted  Chaperone: Courtney Andersen    No results found for this or any previous visit (from the past 24 hour(s)).  Assessment & Plan:  1) Well-Woman Exam  2) Encounter for surveillance of contraceptive pills  Labs/procedures today: CBC, CMET, lipids, Vit D, TSH (will get another day as fasting labs)  Mammogram age 72 or sooner if problems Colonoscopy age 45 or sooner if problems  Orders Placed This Encounter  Procedures  . CBC  . Comp Met (CMET)  . Vitamin D (25 hydroxy)  . Lipid  Panel With LDL/HDL Ratio  . TSH    Meds:  Meds ordered this encounter  Medications  . norgestimate-ethinyl estradiol (MILI) 0.25-35 MG-MCG tablet    Sig: Take 1 tablet by mouth daily.    Dispense:  84 tablet    Refill:  4    Order Specific Question:   Supervising Provider    Answer:   Jonnie Kind [[id number]]    Follow-up: Return in about 1 year (around 09/12/2020) for Pap & Physical.  Myrtis Ser [facility] 09/13/2019 3:32 PM

## 2020-02-24 ENCOUNTER — Other Ambulatory Visit: Payer: Self-pay

## 2020-02-24 ENCOUNTER — Ambulatory Visit
Admission: EM | Admit: 2020-02-24 | Discharge: 2020-02-24 | Disposition: A | Discharge status: Home / Self Care | Payer: 59 | Attending: Emergency Medicine | Admitting: Emergency Medicine

## 2020-02-24 ENCOUNTER — Encounter: Payer: Self-pay | Admitting: Emergency Medicine

## 2020-02-24 DIAGNOSIS — Z23 Encounter for immunization: Secondary | ICD-10-CM

## 2020-02-24 DIAGNOSIS — S61412A Laceration without foreign body of left hand, initial encounter: Secondary | ICD-10-CM | POA: Diagnosis not present

## 2020-02-24 MED ORDER — TETANUS-DIPHTH-ACELL PERTUSSIS 5-2.5-18.5 LF-MCG/0.5 IM SUSY
0.5000 mL | PREFILLED_SYRINGE | Freq: Once | INTRAMUSCULAR | Status: AC
  Administered 2020-02-24: 18:00:00 0.5 mL via INTRAMUSCULAR

## 2020-02-24 NOTE — Discharge Instructions (Addendum)
Bandage applied Keep covered for next and dry for next 24-48 hours.  After then you may gently clean with warm water and mild soap.  Avoid submerging wound in water. Change dressing daily and apply a thin layer of neosporin.  Return in 7-10 days to have sutures removed.   Take OTC ibuprofen or tylenol as needed for pain releif Return sooner or go to the ED if you have any new or worsening symptoms such as increased pain, redness, swelling, drainage, discharge, decreased range of motion of extremity, etc..   

## 2020-02-24 NOTE — ED Triage Notes (Signed)
Laceration between thumb and index finger to LT hand from knife trying to open a pack of chicken. Not up to date on tetanus.

## 2020-02-24 NOTE — ED Provider Notes (Signed)
[place]   SE:[id number] 02/24/20 Arrival Time: 1636  CC: LACERATION  SUBJECTIVE:  Courtney Andersen is a 39 y.o. female who presents with a laceration of left hand  that occurred 30 minutes ago.  Located laceration through the left palmar hand.  Developed the symptom after mistakenly cutting herself with a knife.  Bleeding controlled.  Currently not on blood thinners.  Denies similar symptoms in the past.  Denies fever, chills, nausea, vomiting, redness, swelling, purulent drainage, decrease strength or sensation.   Td UTD: NO.  ROS: As per HPI.  All other pertinent ROS negative.     Past Medical History:  Diagnosis Date   Abnormal Pap smear    Anemia    Fibroids 11/24/2012   History of blood transfusion 09/2014   3 units transfused at [facility]   Hx of blood transfusion reaction 09/2015   x 3 units [facility]   Menorrhagia 11/24/2012   Other and unspecified ovarian cyst 11/24/2012   Vaginal Pap smear, abnormal    Past Surgical History:  Procedure Laterality Date   COLPOSCOPY W/ BIOPSY / CURETTAGE     DILATATION & CURETTAGE/HYSTEROSCOPY WITH MYOSURE N/A 02/08/2016   Procedure: HYSTEROSCOPY RESECTION OF MYOMA  WITH MYOSURE;  Surgeon: Jonnie Kind, MD;  Location: [facility];  Service: Gynecology;  Laterality: N/A;   LEEP     MYOMECTOMY     WISDOM TOOTH EXTRACTION     Allergies  Allergen Reactions   Sunscreens Hives   No current facility-administered medications on file prior to encounter.   Current Outpatient Medications on File Prior to Encounter  Medication Sig Dispense Refill   norgestimate-ethinyl estradiol (MILI) 0.25-35 MG-MCG tablet Take 1 tablet by mouth daily. 84 tablet 4   Social History   Socioeconomic History   Marital status: Married    Spouse name: Not on file   Number of children: 0   Years of education: Not on file   Highest education level: Not on file  Occupational History   Not on file  Tobacco Use   Smoking  status: Passive Smoke Exposure - Never Smoker   Smokeless tobacco: Never Used  Vaping Use   Vaping Use: Never used  Substance and Sexual Activity   Alcohol use: Yes    Comment: wine weekends   Drug use: No   Sexual activity: Yes    Partners: Male    Birth control/protection: Pill  Other Topics Concern   Not on file  Social History Narrative   Not on file   Social Determinants of Health   Financial Resource Strain: Low Risk    Difficulty of Paying Living Expenses: Not hard at all  Food Insecurity: No Food Insecurity   Worried About Charity fundraiser in the Last Year: Never true   [place] in the Last Year: Never true  Transportation Needs: No Transportation Needs   Lack of Transportation (Medical): No   Lack of Transportation (Non-Medical): No  Physical Activity: Sufficiently Active   Days of Exercise per Week: 5 days   Minutes of Exercise per Session: 40 min  Stress: No Stress Concern Present   Feeling of Stress : Only a little  Social Connections: Moderately Isolated   Frequency of Communication with Friends and Family: Three times a week   Frequency of Social Gatherings with Friends and Family: More than three times a week   Attends Religious Services: Never   Marine scientist or Organizations: No   Attends  Club or Organization Meetings: Never   Marital Status: Married  Human resources officer Violence: Not At Risk   Fear of Current or Ex-Partner: No   Emotionally Abused: No   Physically Abused: No   Sexually Abused: No   Family History  Problem Relation Age of Onset   Hypertension Mother    Diabetes Father    Hypertension Father    Diabetes Brother    Cancer Paternal Grandmother    Cancer Paternal Aunt    Breast cancer Maternal Aunt      OBJECTIVE:  Vitals:   02/24/20 1654  BP: (!) 153/91  Pulse: 73  Resp: 18  Temp: 98.6 F (37 C)  TempSrc: Oral  SpO2: 96%     General appearance: alert; no distress Heart:  RRR, no rub, gallop or murmur Chest: CTA, heart sounds normal Skin: laceration of palmar hand; size: approx 1 cm Psychological: alert and cooperative; normal mood and affect   Results for orders placed or performed in visit on 05/18/19  Novel Coronavirus, NAA ([facility])   Specimen: Nasopharyngeal(NP) swabs in vial transport medium   NASOPHARYNGE  TESTING  Result Value Ref Range   SARS-CoV-2, NAA Not Detected Not Detected    Labs Reviewed - No data to display  No results found.  Procedure: Verbal consent obtained. Patient provided with risks and alternatives to the procedure. Wound copiously irrigated with NS then cleansed with betadine. Anesthetized with 2 mL of lidocaine topical with epinephrine after LET. Wound carefully explored. No foreign body, tendon injury, or nonviable tissue were noted. Using sterile technique 1 interrupted 5-0 Ethilon Prolene sutures were placed to reapproximate the wound. Patient tolerated procedure well. No complications. Minimal bleeding. Patient advised to look for and return for any signs of infection such as redness, swelling, discharge, or worsening pain. Return for suture removal in 7 days.  ASSESSMENT & PLAN:  1. Laceration of left hand without foreign body, initial encounter     Meds ordered this encounter  Medications   Tdap (BOOSTRIX) injection 0.5 mL    Discharge Instructions  Bandage applied Keep covered for next and dry for next 24-48 hours.  After then you may gently clean with warm water and mild soap.  Avoid submerging wound in water. Change dressing daily and apply a thin layer of neosporin.  Return in 7 - 10 days to have sutures removed.   Take OTC ibuprofen or tylenol as needed for pain releif Return sooner or go to the ED if you have any new or worsening symptoms such as increased pain, redness, swelling, drainage, discharge, decreased range of motion of extremity, etc..     Reviewed expectations re: course of current medical  issues. Questions answered. Outlined signs and symptoms indicating need for more acute intervention. Patient verbalized understanding. After Visit Summary given.   Courtney Andersen, Courtney Andersen 02/24/20 1725

## 2020-07-28 ENCOUNTER — Other Ambulatory Visit: Payer: Self-pay

## 2020-07-28 ENCOUNTER — Encounter: Payer: Self-pay | Admitting: Emergency Medicine

## 2020-07-28 ENCOUNTER — Ambulatory Visit
Admission: EM | Admit: 2020-07-28 | Discharge: 2020-07-28 | Disposition: A | Discharge status: Home / Self Care | Payer: 59 | Attending: Emergency Medicine | Admitting: Emergency Medicine

## 2020-07-28 DIAGNOSIS — R059 Cough, unspecified: Secondary | ICD-10-CM | POA: Diagnosis not present

## 2020-07-28 DIAGNOSIS — H66001 Acute suppurative otitis media without spontaneous rupture of ear drum, right ear: Secondary | ICD-10-CM

## 2020-07-28 MED ORDER — AMOXICILLIN-POT CLAVULANATE 875-125 MG PO TABS: 1.0000 | ORAL_TABLET | Freq: Two times a day (BID) | ORAL | 0 refills | Status: AC

## 2020-07-28 MED ORDER — BENZONATATE 100 MG PO CAPS: 100.0000 mg | ORAL_CAPSULE | Freq: Three times a day (TID) | ORAL | 0 refills | Status: DC

## 2020-07-28 NOTE — Discharge Instructions (Signed)
Will treat for ear infection Augmentin prescribed This medication may cover for other infection also We will hold off on x-rays today Get plenty of rest and push fluids Tessalon Perles prescribed for cough Use OTC zyrtec for nasal congestion, runny nose, and/or sore throat Use OTC flonase for nasal congestion and runny nose Use medications daily for symptom relief Use OTC medications like ibuprofen or tylenol as needed fever or pain Call or go to the ED if you have any new or worsening symptoms such as fever, worsening cough, shortness of breath, chest tightness, chest pain, turning blue, changes in mental status, etc..Marland Kitchen

## 2020-07-28 NOTE — ED Triage Notes (Signed)
Pt sts cough and congestion x 3 weeks that is not improving with OTC meds

## 2020-07-28 NOTE — ED Provider Notes (Signed)
[place]   [id number] 07/28/20 Arrival Time: 3762   CC: Cough  SUBJECTIVE: History from: patient.  Courtney Andersen is a 40 y.o. female who presents with congestion, RT ear pain, fever, and cough x 3 week.  Denies sick exposure to COVID, flu or strep.  Has tried OTC medications without relief.  Denies aggravating factors.  Denies previous symptoms in the past.   Denies SOB, wheezing, chest pain, nausea, changes in bowel or bladder habits.    ROS: As per HPI.  All other pertinent ROS negative.     Past Medical History:  Diagnosis Date  . Abnormal Pap smear   . Anemia   . Fibroids 11/24/2012  . History of blood transfusion 09/2014   3 units transfused at [facility]  . Hx of blood transfusion reaction 09/2015   x 3 units [facility]  . Menorrhagia 11/24/2012  . Other and unspecified ovarian cyst 11/24/2012  . Vaginal Pap smear, abnormal    Past Surgical History:  Procedure Laterality Date  . COLPOSCOPY W/ BIOPSY / CURETTAGE    . DILATATION & CURETTAGE/HYSTEROSCOPY WITH MYOSURE N/A 02/08/2016   Procedure: HYSTEROSCOPY RESECTION OF MYOMA  WITH MYOSURE;  Surgeon: Jonnie Kind, MD;  Location: [facility];  Service: Gynecology;  Laterality: N/A;  . LEEP    . MYOMECTOMY    . WISDOM TOOTH EXTRACTION     Allergies  Allergen Reactions  . Sunscreens Hives   No current facility-administered medications on file prior to encounter.   Current Outpatient Medications on File Prior to Encounter  Medication Sig Dispense Refill  . norgestimate-ethinyl estradiol (MILI) 0.25-35 MG-MCG tablet Take 1 tablet by mouth daily. 84 tablet 4   Social History   Socioeconomic History  . Marital status: Married    Spouse name: Not on file  . Number of children: 0  . Years of education: Not on file  . Highest education level: Not on file  Occupational History  . Not on file  Tobacco Use  . Smoking status: Passive Smoke Exposure - Never Smoker  . Smokeless tobacco: Never Used  Vaping Use   . Vaping Use: Never used  Substance and Sexual Activity  . Alcohol use: Yes    Comment: wine weekends  . Drug use: No  . Sexual activity: Yes    Partners: Male    Birth control/protection: Pill  Other Topics Concern  . Not on file  Social History Narrative  . Not on file   Social Determinants of Health   Financial Resource Strain: Low Risk   . Difficulty of Paying Living Expenses: Not hard at all  Food Insecurity: No Food Insecurity  . Worried About Charity fundraiser in the Last Year: Never true  . Ran Out of Food in the Last Year: Never true  Transportation Needs: No Transportation Needs  . Lack of Transportation (Medical): No  . Lack of Transportation (Non-Medical): No  Physical Activity: Sufficiently Active  . Days of Exercise per Week: 5 days  . Minutes of Exercise per Session: 40 min  Stress: No Stress Concern Present  . Feeling of Stress : Only a little  Social Connections: Moderately Isolated  . Frequency of Communication with Friends and Family: Three times a week  . Frequency of Social Gatherings with Friends and Family: More than three times a week  . Attends Religious Services: Never  . Active Member of Clubs or Organizations: No  . Attends Archivist Meetings: Never  . Marital  Status: Married  Human resources officer Violence: Not At Risk  . Fear of Current or Ex-Partner: No  . Emotionally Abused: No  . Physically Abused: No  . Sexually Abused: No   Family History  Problem Relation Age of Onset  . Hypertension Mother   . Diabetes Father   . Hypertension Father   . Diabetes Brother   . Cancer Paternal Grandmother   . Cancer Paternal Aunt   . Breast cancer Maternal Aunt     OBJECTIVE:  Vitals:   07/28/20 1450  BP: (!) 162/103  Pulse: 91  Resp: 18  Temp: 98.7 F (37.1 C)  TempSrc: Oral  SpO2: 97%    General appearance: alert; appears fatigued, but nontoxic; speaking in full sentences and tolerating own secretions HEENT: NCAT; Ears: EACs  clear, TMs pearly gray; Eyes: PERRL.  EOM grossly intact. Nose: nares patent without rhinorrhea, Throat: oropharynx clear, tonsils non erythematous or enlarged, uvula midline  Neck: supple without LAD Lungs: unlabored respirations, symmetrical air entry; cough: moderate; no respiratory distress; CTAB Heart: regular rate and rhythm.  Skin: warm and dry Psychological: alert and cooperative; normal mood and affect  ASSESSMENT & PLAN:  1. Cough   2. Non-recurrent acute suppurative otitis media of right ear without spontaneous rupture of tympanic membrane     Meds ordered this encounter  Medications  . amoxicillin-clavulanate (AUGMENTIN) 875-125 MG tablet    Sig: Take 1 tablet by mouth every 12 (twelve) hours for 10 days.    Dispense:  20 tablet    Refill:  0    Order Specific Question:   Supervising Provider    Answer:   Raylene Everts [[id number]]  . benzonatate (TESSALON) 100 MG capsule    Sig: Take 1 capsule (100 mg total) by mouth every 8 (eight) hours.    Dispense:  21 capsule    Refill:  0    Order Specific Question:   Supervising Provider    Answer:   Raylene Everts [[id number]]    Will treat for ear infection Augmentin prescribed This medication may cover for other infection also We will hold off on x-rays today Get plenty of rest and push fluids Tessalon Perles prescribed for cough Use OTC zyrtec for nasal congestion, runny nose, and/or sore throat Use OTC flonase for nasal congestion and runny nose Use medications daily for symptom relief Use OTC medications like ibuprofen or tylenol as needed fever or pain Call or go to the ED if you have any new or worsening symptoms such as fever, worsening cough, shortness of breath, chest tightness, chest pain, turning blue, changes in mental status, etc...   Reviewed expectations re: course of current medical issues. Questions answered. Outlined signs and symptoms indicating need for more acute intervention. Patient verbalized  understanding. After Visit Summary given.         Lestine Box, PA-C 07/28/20 [phone]

## 2020-08-17 ENCOUNTER — Ambulatory Visit (INDEPENDENT_AMBULATORY_CARE_PROVIDER_SITE_OTHER): Payer: 59 | Admitting: Adult Health

## 2020-08-17 ENCOUNTER — Other Ambulatory Visit (HOSPITAL_COMMUNITY)
Admission: RE | Admit: 2020-08-17 | Discharge: 2020-08-17 | Disposition: A | Discharge status: Home / Self Care | Payer: 59 | Source: Ambulatory Visit | Attending: Adult Health | Admitting: Adult Health

## 2020-08-17 ENCOUNTER — Other Ambulatory Visit: Payer: Self-pay

## 2020-08-17 ENCOUNTER — Encounter: Payer: Self-pay | Admitting: Adult Health

## 2020-08-17 VITALS — BP 118/79 | HR 68 | Ht 67.0 in | Wt 202.0 lb

## 2020-08-17 DIAGNOSIS — Z3041 Encounter for surveillance of contraceptive pills: Secondary | ICD-10-CM

## 2020-08-17 DIAGNOSIS — Z01419 Encounter for gynecological examination (general) (routine) without abnormal findings: Secondary | ICD-10-CM | POA: Diagnosis present

## 2020-08-17 MED ORDER — NORGESTIMATE-ETH ESTRADIOL 0.25-35 MG-MCG PO TABS: 1.0000 | ORAL_TABLET | Freq: Every day | ORAL | 4 refills | Status: DC

## 2020-08-17 NOTE — Progress Notes (Signed)
Patient ID: Courtney Andersen, female   DOB: December 07, 1980, 40 y.o.   MRN: [id number] History of Present Illness: Courtney Andersen is a 40 year old white female, married, G0P0, in for a well woman gyn exam and pap.    Current Medications, Allergies, Past Medical History, Past Surgical History, Family History and Social History were reviewed in [employer] record.     Review of Systems:  Patient denies any headaches, hearing loss, fatigue, blurred vision, shortness of breath, chest pain, abdominal pain, problems with bowel movements, urination, or intercourse. No joint pain or mood swings.    Physical Exam:BP 118/79 (BP Location: Left Arm, Patient Position: Sitting, Cuff Size: Large)   Pulse 68   Ht 5\' 7"  (1.702 m)   Wt 202 lb (91.6 kg)   LMP 08/17/2020   BMI 31.64 kg/m   General:  Well developed, well nourished, no acute distress Skin:  Warm and dry Neck:  Midline trachea, normal thyroid, good ROM, no lymphadenopathy Lungs; Clear to auscultation bilaterally Breast:  No dominant palpable mass, retraction, or nipple discharge Cardiovascular: Regular rate and rhythm Abdomen:  Soft, non tender, no hepatosplenomegaly Pelvic:  External genitalia is normal in appearance, no lesions.  The vagina is normal in appearance. Urethra has no lesions or masses. The cervix is smooth, pap with HR HPV genotyping performed.  Uterus is felt to be normal size, shape, and contour.  No adnexal masses or tenderness noted.Bladder is non tender, no masses felt. Rectal: Deferred. Extremities/musculoskeletal:  No swelling or varicosities noted, no clubbing or cyanosis Psych:  No mood changes, alert and cooperative,seems happy AA is 2 Fall risk is low Depression screen [facility] 2/9 08/17/2020 09/13/2019 08/20/2018  Decreased Interest 0 0 0  Down, Depressed, Hopeless 0 0 0  PHQ - 2 Score 0 0 0  Altered sleeping 0 0 0  Tired, decreased energy 0 1 0  Change in appetite 1 0 0  Feeling bad or failure about  yourself  0 0 0  Trouble concentrating 0 0 0  Moving slowly or fidgety/restless 0 0 0  Suicidal thoughts 0 0 0  PHQ-9 Score 1 1 0  Difficult doing work/chores - Not difficult at all Not difficult at all    GAD 7 : Generalized Anxiety Score 08/17/2020 09/13/2019  Nervous, Anxious, on Edge 0 1  Control/stop worrying 0 0  Worry too much - different things 0 1  Trouble relaxing 0 0  Restless 0 0  Easily annoyed or irritable 0 0  Afraid - awful might happen 0 0  Total GAD 7 Score 0 2  Anxiety Difficulty - Not difficult at all      Upstream - 08/17/20 1511       Pregnancy Intention Screening   Does the patient want to become pregnant in the next year? No    Does the patient's partner want to become pregnant in the next year? No    Would the patient like to discuss contraceptive options today? No      Contraception Wrap Up   Current Method Oral Contraceptive    End Method Oral Contraceptive    Contraception Counseling Provided No            Examination chaperoned by Levy Pupa LPN   Impression and Plan: 1. Encounter for gynecological examination with Papanicolaou smear of cervix Pap sent Physical in 1 year Pap in 3 if normal Mammogram at 41 Check CBC,CMP,TSH and lipids   2. Encounter for surveillance of contraceptive pills  Continue OCs Meds ordered this encounter  Medications   norgestimate-ethinyl estradiol (MILI) 0.25-35 MG-MCG tablet    Sig: Take 1 tablet by mouth daily.    Dispense:  84 tablet    Refill:  4    Order Specific Question:   Supervising Provider    Answer:   Tania Ade H [[id number]]

## 2020-08-18 LAB — COMPREHENSIVE METABOLIC PANEL
ALT: 15 IU/L (ref 0–32)
AST: 16 IU/L (ref 0–40)
Albumin/Globulin Ratio: 1.7 (ref 1.2–2.2)
Albumin: 4.2 g/dL (ref 3.8–4.8)
Alkaline Phosphatase: 54 IU/L (ref 44–121)
BUN/Creatinine Ratio: 15 (ref 9–23)
BUN: 11 mg/dL (ref 6–20)
Bilirubin Total: 0.4 mg/dL (ref 0.0–1.2)
CO2: 22 mmol/L (ref 20–29)
Calcium: 9 mg/dL (ref 8.7–10.2)
Chloride: 102 mmol/L (ref 96–106)
Creatinine, Ser: 0.72 mg/dL (ref 0.57–1.00)
Globulin, Total: 2.5 g/dL (ref 1.5–4.5)
Glucose: 92 mg/dL (ref 65–99)
Potassium: 4.2 mmol/L (ref 3.5–5.2)
Sodium: 138 mmol/L (ref 134–144)
Total Protein: 6.7 g/dL (ref 6.0–8.5)
eGFR: 109 mL/min/{1.73_m2} (ref >59)

## 2020-08-18 LAB — LIPID PANEL
Chol/HDL Ratio: 3.4 ratio (ref 0.0–4.4)
Cholesterol, Total: 226 mg/dL — ABNORMAL HIGH (ref 100–199)
HDL: 67 mg/dL (ref >39)
LDL Chol Calc (NIH): 108 mg/dL — ABNORMAL HIGH (ref 0–99)
Triglycerides: 303 mg/dL — ABNORMAL HIGH (ref 0–149)
VLDL Cholesterol Cal: 51 mg/dL — ABNORMAL HIGH (ref 5–40)

## 2020-08-18 LAB — CBC
Hematocrit: 41.2 % (ref 34.0–46.6)
Hemoglobin: 13.6 g/dL (ref 11.1–15.9)
MCH: 29.5 pg (ref 26.6–33.0)
MCHC: 33 g/dL (ref 31.5–35.7)
MCV: 89 fL (ref 79–97)
Platelets: 243 10*3/uL (ref 150–450)
RBC: 4.61 x10E6/uL (ref 3.77–5.28)
RDW: 13.4 % (ref 11.7–15.4)
WBC: 6.9 10*3/uL (ref 3.4–10.8)

## 2020-08-18 LAB — TSH: TSH: 0.739 u[IU]/mL (ref 0.450–4.500)

## 2020-08-21 ENCOUNTER — Telehealth: Payer: Self-pay | Admitting: Adult Health

## 2020-08-21 LAB — CYTOLOGY - PAP
Comment: NEGATIVE
Diagnosis: NEGATIVE
High risk HPV: NEGATIVE

## 2020-10-12 ENCOUNTER — Other Ambulatory Visit (INDEPENDENT_AMBULATORY_CARE_PROVIDER_SITE_OTHER): Payer: 59

## 2020-10-12 ENCOUNTER — Other Ambulatory Visit: Payer: Self-pay

## 2020-10-12 VITALS — BP 142/85 | HR 76

## 2020-10-12 DIAGNOSIS — R3915 Urgency of urination: Secondary | ICD-10-CM | POA: Diagnosis not present

## 2020-10-12 DIAGNOSIS — R300 Dysuria: Secondary | ICD-10-CM

## 2020-10-12 DIAGNOSIS — R350 Frequency of micturition: Secondary | ICD-10-CM

## 2020-10-12 LAB — POCT URINALYSIS DIPSTICK
Glucose, UA: NEGATIVE
Ketones, UA: NEGATIVE
Leukocytes, UA: NEGATIVE
Nitrite, UA: NEGATIVE
Protein, UA: NEGATIVE

## 2020-10-12 NOTE — Progress Notes (Signed)
Chart reviewed for nurse visit. Agree with plan of care.  Estill Dooms, NP 10/12/2020 4:56 PM

## 2020-10-12 NOTE — Progress Notes (Signed)
   NURSE VISIT- UTI SYMPTOMS   SUBJECTIVE:  Courtney Andersen is a 40 y.o. G0P0000 female here for UTI symptoms. She is a GYN patient. She reports dysuria and urinary urgency.  OBJECTIVE:  BP (!) 142/85 (BP Location: Right Arm, Patient Position: Sitting, Cuff Size: Normal)   Pulse 76   LMP 09/07/2020   Appears well, in no apparent distress  Results for orders placed or performed in visit on 10/12/20 (from the past 24 hour(s))  POCT Urinalysis Dipstick   Collection Time: 10/12/20  3:12 PM  Result Value Ref Range   Color, UA     Clarity, UA     Glucose, UA Negative Negative   Bilirubin, UA     Ketones, UA neg    Spec Grav, UA     Blood, UA small    pH, UA     Protein, UA Negative Negative   Urobilinogen, UA     Nitrite, UA neg    Leukocytes, UA Negative Negative   Appearance fin    Odor      ASSESSMENT: GYN patient with UTI symptoms and negative nitrites  PLAN: Note routed to Derrek Monaco, AGNP   Rx sent by provider today: No Urine culture sent Call or return to clinic prn if these symptoms worsen or fail to improve as anticipated. Follow-up: as needed   Alice Rieger  10/12/2020 3:17 PM

## 2020-10-13 LAB — URINALYSIS, ROUTINE W REFLEX MICROSCOPIC
Bilirubin, UA: NEGATIVE
Glucose, UA: NEGATIVE
Ketones, UA: NEGATIVE
Leukocytes,UA: NEGATIVE
Nitrite, UA: NEGATIVE
Protein,UA: NEGATIVE
Specific Gravity, UA: 1.018 (ref 1.005–1.030)
Urobilinogen, Ur: 0.2 mg/dL (ref 0.2–1.0)
pH, UA: 5.5 (ref 5.0–7.5)

## 2020-10-13 LAB — MICROSCOPIC EXAMINATION
Bacteria, UA: NONE SEEN
Casts: NONE SEEN /lpf

## 2020-10-16 ENCOUNTER — Other Ambulatory Visit: Payer: Self-pay | Admitting: Adult Health

## 2020-10-16 ENCOUNTER — Telehealth: Payer: Self-pay | Admitting: Adult Health

## 2020-10-16 LAB — URINE CULTURE

## 2020-10-16 MED ORDER — NITROFURANTOIN MONOHYD MACRO 100 MG PO CAPS: 100.0000 mg | ORAL_CAPSULE | Freq: Two times a day (BID) | ORAL | 0 refills | Status: DC

## 2020-10-16 NOTE — Telephone Encounter (Signed)
Pt aware that Macrobid has been sent

## 2020-10-16 NOTE — Progress Notes (Signed)
Rx Macrobid for +E  Coli on urine culture

## 2021-05-26 ENCOUNTER — Emergency Department (HOSPITAL_COMMUNITY): Payer: 59

## 2021-05-26 ENCOUNTER — Encounter (HOSPITAL_COMMUNITY): Payer: Self-pay | Admitting: Emergency Medicine

## 2021-05-26 ENCOUNTER — Other Ambulatory Visit: Payer: Self-pay

## 2021-05-26 ENCOUNTER — Emergency Department (HOSPITAL_COMMUNITY)
Admission: EM | Admit: 2021-05-26 | Discharge: 2021-05-26 | Disposition: A | Discharge status: Home / Self Care | Payer: 59 | Attending: Emergency Medicine | Admitting: Emergency Medicine

## 2021-05-26 DIAGNOSIS — S0993XA Unspecified injury of face, initial encounter: Secondary | ICD-10-CM | POA: Diagnosis present

## 2021-05-26 DIAGNOSIS — W06XXXA Fall from bed, initial encounter: Secondary | ICD-10-CM | POA: Insufficient documentation

## 2021-05-26 DIAGNOSIS — S0121XA Laceration without foreign body of nose, initial encounter: Secondary | ICD-10-CM | POA: Diagnosis not present

## 2021-05-26 DIAGNOSIS — S0181XA Laceration without foreign body of other part of head, initial encounter: Secondary | ICD-10-CM

## 2021-05-26 MED ORDER — LIDOCAINE HCL (PF) 1 % IJ SOLN
10.0000 mL | Freq: Once | INTRAMUSCULAR | Status: AC
  Administered 2021-05-26: 10 mL
  Filled 2021-05-26: qty 10

## 2021-05-26 NOTE — ED Provider Notes (Signed)
?[place] ?Provider Note ? ? ?CSN: [id number] ?Arrival date & time: 05/26/21  1531 ? ?  ? ?History ? ?Chief Complaint  ?Patient presents with  ? Laceration  ? ? ?Courtney Andersen is a 41 y.o. female. ? ? ?Laceration ? ?Patient presents with laceration to her nasal bridge.  She reports she was at home, she was napping in her underwear when her husband and friends are returned home and she rushed to get out of bed when she landed on her face because she got tripped on the blankets.  She denies losing consciousness, no vision changes, no neck pain.  She is not on blood thinners, tetanus updated 2 years ago. ? ?Home Medications ?Prior to Admission medications   ?Medication Sig Start Date End Date Taking? Authorizing Provider  ?nitrofurantoin, macrocrystal-monohydrate, (MACROBID) 100 MG capsule Take 1 capsule (100 mg total) by mouth 2 (two) times daily. 10/16/20   Estill Dooms, NP  ?norgestimate-ethinyl estradiol (MILI) 0.25-35 MG-MCG tablet Take 1 tablet by mouth daily. 08/17/20   Estill Dooms, NP  ?   ? ?Allergies    ?Sunscreens   ? ?Review of Systems   ?Review of Systems ? ?Physical Exam ?Updated Vital Signs ?BP (!) 142/88 (BP Location: Right Arm)   Pulse 78   Temp 98.6 ?F (37 ?C) (Oral)   Resp 16   Ht '5\' 7"'$  (1.702 m)   Wt 90.7 kg   LMP 04/08/2020   SpO2 100%   BMI 31.32 kg/m?  ?Physical Exam ?Vitals and nursing note reviewed. Exam conducted with a chaperone present.  ?Constitutional:   ?   General: She is not in acute distress. ?   Appearance: Normal appearance.  ?HENT:  ?   Head: Normocephalic and atraumatic.  ?   Right Ear: Tympanic membrane normal.  ?   Left Ear: Tympanic membrane normal.  ?   Nose: Nose normal.  ?   Comments: No septal hematoma, there is bilateral crusted blood but no active epistaxis.  ?Eyes:  ?   General: No scleral icterus. ?   Extraocular Movements: Extraocular movements intact.  ?   Pupils: Pupils are equal, round, and reactive to light.   ?Cardiovascular:  ?   Rate and Rhythm: Normal rate and regular rhythm.  ?Skin: ?   Coloration: Skin is not jaundiced.  ?Neurological:  ?   Mental Status: She is alert. Mental status is at baseline.  ?   Coordination: Coordination normal.  ? ? ?ED Results / Procedures / Treatments   ?Labs ?(all labs ordered are listed, but only abnormal results are displayed) ?Labs Reviewed - No data to display ? ?EKG ?None ? ?Radiology ?CT Maxillofacial Wo Contrast ? ?Result Date: 05/26/2021 ?CLINICAL DATA:  Patient fell getting out of bed landing on her face. Laceration to the bridge of the nose. EXAM: CT MAXILLOFACIAL WITHOUT CONTRAST TECHNIQUE: Multidetector CT imaging of the maxillofacial structures was performed. Multiplanar CT image reconstructions were also generated. RADIATION DOSE REDUCTION: This exam was performed according to the departmental dose-optimization program which includes automated exposure control, adjustment of the mA and/or kV according to patient size and/or use of iterative reconstruction technique. COMPARISON:  None. FINDINGS: Osseous: No convincing fracture.  No bone lesion. Orbits: Negative. No traumatic or inflammatory finding. Sinuses: Clear. Soft tissues: Small laceration over the anterior aspect of the nose. No radiopaque foreign body. No other soft tissue abnormality. Limited intracranial: No significant or unexpected finding. IMPRESSION: 1. Soft tissue injury to the nose.  No  fractures. Electronically Signed   By: Lajean Manes M.D.   On: 05/26/2021 16:45   ? ?Procedures ?Marland Kitchen.Laceration Repair ? ?Date/Time: 05/26/2021 5:20 PM ?Performed by: Sherrill Raring, PA-C ?Authorized by: Sherrill Raring, PA-C  ? ?Consent:  ?  Consent obtained:  Verbal ?  Consent given by:  Patient ?  Risks discussed:  Infection, need for additional repair, pain, poor cosmetic result and poor wound healing ?  Alternatives discussed:  No treatment and delayed treatment ?Universal protocol:  ?  Procedure explained and questions answered  to patient or proxy's satisfaction: yes   ?  Relevant documents present and verified: yes   ?  Test results available: yes   ?  Imaging studies available: yes   ?  Required blood products, implants, devices, and special equipment available: yes   ?  Site/side marked: yes   ?  Immediately prior to procedure, a time out was called: yes   ?  Patient identity confirmed:  Verbally with patient ?Anesthesia:  ?  Anesthesia method:  Local infiltration ?Laceration details:  ?  Location:  Face ?  Face location:  Nose ?  Length (cm):  1.5 ?  Depth (mm):  2 ?Exploration:  ?  Hemostasis achieved with:  Direct pressure ?  Wound exploration: wound explored through full range of motion and entire depth of wound visualized   ?  Contaminated: no   ?Treatment:  ?  Area cleansed with:  Povidone-iodine ?Skin repair:  ?  Repair method:  Sutures ?  Suture size:  6-0 ?  Suture material:  Prolene ?  Suture technique:  Simple interrupted ?  Number of sutures:  3 ?Approximation:  ?  Approximation:  Close ?Repair type:  ?  Repair type:  Simple ?Post-procedure details:  ?  Dressing:  Antibiotic ointment  ? ? ?Medications Ordered in ED ?Medications  ?lidocaine (PF) (XYLOCAINE) 1 % injection 10 mL (10 mLs Infiltration Given 05/26/21 1719)  ? ? ?ED Course/ Medical Decision Making/ A&P ?  ?                        ?Medical Decision Making ?Amount and/or Complexity of Data Reviewed ?Radiology: ordered. ? ?Risk ?Prescription drug management. ? ? ?Patient presents due to laceration over the nasal bridge.  She has no focal deficits on neuro exam, no signs of basilar skull fracture.  Specifically there is no clear rhinorrhea, periorbital ecchymosis, battle sign.  Additionally there is no hemotympanums.  Patient tolerated laceration repair well without any complications.  Discussed return precautions, I ordered and viewed the CT maxillofacial which did not show any acute findings.  Patient was discharged in stable condition ? ? ? ? ? ? ? ?Final Clinical  Impression(s) / ED Diagnoses ?Final diagnoses:  ?Facial laceration, initial encounter  ? ? ?Rx / DC Orders ?ED Discharge Orders   ? ? None  ? ?  ? ? ?  ?Sherrill Raring, PA-C ?05/26/21 2153 ? ?  ?Isla Pence, MD ?05/27/21 1549 ? ?

## 2021-05-26 NOTE — Discharge Instructions (Signed)
You will need to have sutures removed in 7 to 10 days.  He can do that in urgent care, ED here at your primary's office.  Apply topical antibiotic ointment, keep dry for 24 hours after that it is okay to shower. ?

## 2021-05-26 NOTE — ED Triage Notes (Addendum)
Patient c/o laceration to bridge of nose. Per patient was trying to get out of bed and got wrapped in sheet and fell landing on face. Denies LOC, dizziness, or headache. Patient does report bleeding from both nostrils prior to coming to ED. Bleeding from nose and laceration stopped. 2cm laceration noted.  Denies taking any type of anticoagulant. Tetanus vaccination up to date.  ?

## 2021-09-26 ENCOUNTER — Encounter: Payer: Self-pay | Admitting: Adult Health

## 2021-09-26 ENCOUNTER — Ambulatory Visit (INDEPENDENT_AMBULATORY_CARE_PROVIDER_SITE_OTHER): Payer: 59 | Admitting: Adult Health

## 2021-09-26 VITALS — BP 123/80 | HR 72 | Ht 67.0 in | Wt 199.0 lb

## 2021-09-26 DIAGNOSIS — Z1211 Encounter for screening for malignant neoplasm of colon: Secondary | ICD-10-CM

## 2021-09-26 DIAGNOSIS — Z01419 Encounter for gynecological examination (general) (routine) without abnormal findings: Secondary | ICD-10-CM | POA: Diagnosis not present

## 2021-09-26 DIAGNOSIS — E782 Mixed hyperlipidemia: Secondary | ICD-10-CM

## 2021-09-26 LAB — HEMOCCULT GUIAC POC 1CARD (OFFICE): Fecal Occult Blood, POC: NEGATIVE

## 2021-09-26 NOTE — Progress Notes (Signed)
Patient ID: Courtney Andersen, female   DOB: 11-29-1980, 41 y.o.   MRN: [id number] History of Present Illness: Courtney Andersen is a 41 year old white female, married, G0P0, in for a well woman gyn exam.  Lab Results  Component Value Date   DIAGPAP  08/17/2020    - Negative for intraepithelial lesion or malignancy (NILM)   HPV NOT DETECTED 08/22/2017   [place] Negative 08/17/2020    Current Medications, Allergies, Past Medical History, Past Surgical History, Family History and Social History were reviewed in [employer] record.     Review of Systems: Patient denies any headaches, hearing loss, fatigue, blurred vision, shortness of breath, chest pain, abdominal pain, problems with bowel movements, urination, or intercourse. No joint pain or mood swings.     Physical Exam:BP 123/80 (BP Location: Left Arm, Patient Position: Sitting, Cuff Size: Normal)   Pulse 72   Ht '5\' 7"'$  (1.702 m)   Wt 199 lb (90.3 kg)   LMP 09/07/2021 (Approximate)   BMI 31.17 kg/m   General:  Well developed, well nourished, no acute distress Skin:  Warm and dry Neck:  Midline trachea, normal thyroid, good ROM, no lymphadenopathy Lungs; Clear to auscultation bilaterally Breast:  No dominant palpable mass, retraction, or nipple discharge Cardiovascular: Regular rate and rhythm Abdomen:  Soft, non tender, no hepatosplenomegaly Pelvic:  External genitalia is normal in appearance, no lesions.  The vagina is normal in appearance. Urethra has no lesions or masses. The cervix is smooth  Uterus is felt to be normal size, shape, and contour.  No adnexal masses or tenderness noted.Bladder is non tender, no masses felt. Rectal: Good sphincter tone, no polyps, or hemorrhoids felt.  Hemoccult negative. Extremities/musculoskeletal:  No swelling or varicosities noted, no clubbing or cyanosis Psych:  No mood changes, alert and cooperative,seems happy AA is 2 Fall risk is moderate    09/26/2021    2:54 PM 08/17/2020     3:09 PM 09/13/2019    2:47 PM  Depression screen PHQ 2/9  Decreased Interest 0 0 0  Down, Depressed, Hopeless 0 0 0  PHQ - 2 Score 0 0 0  Altered sleeping 0 0 0  Tired, decreased energy 0 0 1  Change in appetite 0 1 0  Feeling bad or failure about yourself  0 0 0  Trouble concentrating 0 0 0  Moving slowly or fidgety/restless 0 0 0  Suicidal thoughts 0 0 0  PHQ-9 Score 0 1 1  Difficult doing work/chores   Not difficult at all       09/26/2021    2:55 PM 08/17/2020    3:09 PM 09/13/2019    2:48 PM  GAD 7 : Generalized Anxiety Score  Nervous, Anxious, on Edge 1 0 1  Control/stop worrying 0 0 0  Worry too much - different things 0 0 1  Trouble relaxing 0 0 0  Restless 0 0 0  Easily annoyed or irritable 1 0 0  Afraid - awful might happen 0 0 0  Total GAD 7 Score 2 0 2  Anxiety Difficulty   Not difficult at all      Upstream - 09/26/21 1503       Pregnancy Intention Screening   Does the patient want to become pregnant in the next year? Ok Either Way    Does the patient's partner want to become pregnant in the next year? Ok Either Way    Would the patient like to discuss contraceptive options today? No  Contraception Wrap Up   Current Method No Method - Other Reason    End Method No Method - Other Reason             Examination chaperoned by Levy Pupa LPN   Impression and Plan: 1. Encounter for well woman exam with routine gynecological exam Physical in 1 year Pap in 2025   2. Encounter for screening fecal occult blood testing Hemoccult was negative  - POCT occult blood stool  3. Elevated cholesterol with elevated triglycerides Has been taking red yeast rice Will check labs fasting  - Lipid panel - Comprehensive metabolic panel

## 2021-10-23 ENCOUNTER — Other Ambulatory Visit (HOSPITAL_COMMUNITY): Payer: Self-pay | Admitting: Adult Health

## 2021-10-23 DIAGNOSIS — Z1231 Encounter for screening mammogram for malignant neoplasm of breast: Secondary | ICD-10-CM

## 2021-11-15 ENCOUNTER — Encounter (HOSPITAL_COMMUNITY): Payer: Self-pay

## 2021-11-15 ENCOUNTER — Ambulatory Visit (HOSPITAL_COMMUNITY)
Admission: RE | Admit: 2021-11-15 | Discharge: 2021-11-15 | Disposition: A | Discharge status: Home / Self Care | Payer: 59 | Source: Ambulatory Visit | Attending: Adult Health | Admitting: Adult Health

## 2021-11-15 DIAGNOSIS — Z1231 Encounter for screening mammogram for malignant neoplasm of breast: Secondary | ICD-10-CM | POA: Diagnosis present

## 2021-11-16 LAB — LIPID PANEL
Chol/HDL Ratio: 3.9 ratio (ref 0.0–4.4)
Cholesterol, Total: 198 mg/dL (ref 100–199)
HDL: 51 mg/dL (ref >39)
LDL Chol Calc (NIH): 123 mg/dL — ABNORMAL HIGH (ref 0–99)
Triglycerides: 137 mg/dL (ref 0–149)
VLDL Cholesterol Cal: 24 mg/dL (ref 5–40)

## 2021-11-16 LAB — COMPREHENSIVE METABOLIC PANEL
ALT: 23 IU/L (ref 0–32)
AST: 23 IU/L (ref 0–40)
Albumin/Globulin Ratio: 2.2 (ref 1.2–2.2)
Albumin: 4.6 g/dL (ref 3.9–4.9)
Alkaline Phosphatase: 71 IU/L (ref 44–121)
BUN/Creatinine Ratio: 15 (ref 9–23)
BUN: 12 mg/dL (ref 6–24)
Bilirubin Total: 1 mg/dL (ref 0.0–1.2)
CO2: 22 mmol/L (ref 20–29)
Calcium: 9.3 mg/dL (ref 8.7–10.2)
Chloride: 106 mmol/L (ref 96–106)
Creatinine, Ser: 0.82 mg/dL (ref 0.57–1.00)
Globulin, Total: 2.1 g/dL (ref 1.5–4.5)
Glucose: 96 mg/dL (ref 70–99)
Potassium: 4.3 mmol/L (ref 3.5–5.2)
Sodium: 142 mmol/L (ref 134–144)
Total Protein: 6.7 g/dL (ref 6.0–8.5)
eGFR: 93 mL/min/{1.73_m2} (ref >59)

## 2022-07-05 ENCOUNTER — Encounter: Payer: Self-pay | Admitting: Adult Health

## 2022-07-05 ENCOUNTER — Ambulatory Visit (INDEPENDENT_AMBULATORY_CARE_PROVIDER_SITE_OTHER): Payer: Managed Care, Other (non HMO) | Admitting: Adult Health

## 2022-07-05 VITALS — BP 136/85 | HR 65 | Ht 67.0 in | Wt 201.4 lb

## 2022-07-05 DIAGNOSIS — W57XXXA Bitten or stung by nonvenomous insect and other nonvenomous arthropods, initial encounter: Secondary | ICD-10-CM

## 2022-07-05 DIAGNOSIS — M545 Low back pain, unspecified: Secondary | ICD-10-CM

## 2022-07-05 DIAGNOSIS — R509 Fever, unspecified: Secondary | ICD-10-CM

## 2022-07-05 DIAGNOSIS — S30861A Insect bite (nonvenomous) of abdominal wall, initial encounter: Secondary | ICD-10-CM

## 2022-07-05 DIAGNOSIS — R109 Unspecified abdominal pain: Secondary | ICD-10-CM | POA: Diagnosis not present

## 2022-07-05 LAB — POCT URINALYSIS DIPSTICK OB
Blood, UA: NEGATIVE
Glucose, UA: NEGATIVE
Ketones, UA: NEGATIVE
Leukocytes, UA: NEGATIVE
Nitrite, UA: NEGATIVE
POC,PROTEIN,UA: NEGATIVE

## 2022-07-05 MED ORDER — SULFAMETHOXAZOLE-TRIMETHOPRIM 800-160 MG PO TABS: 1.0000 | ORAL_TABLET | Freq: Two times a day (BID) | ORAL | 0 refills | Status: DC

## 2022-07-05 NOTE — Progress Notes (Signed)
Subjective:     Patient ID: Courtney Andersen, female   DOB: 1980-12-17, 42 y.o.   MRN: [id number]  HPI Courtney Andersen is a 42 year old white female, married, G0P0, worked in for abdominal discomfort and low back pain, had fever last night 100.5, took meds.     Component Value Date/Time   DIAGPAP  08/17/2020 1516    - Negative for intraepithelial lesion or malignancy (NILM)   DIAGPAP  08/22/2017 0000    NEGATIVE FOR INTRAEPITHELIAL LESIONS OR MALIGNANCY.   DIAGPAP  08/21/2016 0000    NEGATIVE FOR INTRAEPITHELIAL LESIONS OR MALIGNANCY.   HPVHIGH Negative 08/17/2020 1516   ADEQPAP  08/17/2020 1516    Satisfactory for evaluation; transformation zone component PRESENT.   ADEQPAP  08/22/2017 0000    Satisfactory for evaluation  endocervical/transformation zone component PRESENT.   ADEQPAP  08/21/2016 0000    Satisfactory for evaluation  endocervical/transformation zone component PRESENT.   Review of Systems Has had discomfort (twinges) abdomen/pelvic area for about a week, and low back pain Has had some constipation then had diarrhea last night  Has some pain with sex at times   Had tick bite last weekend Had fever 100.5 last night  Reviewed past medical,surgical, social and family history. Reviewed medications and allergies.  Objective:   Physical Exam BP 136/85 (BP Location: Right Arm, Patient Position: Sitting, Cuff Size: Normal)   Pulse 65   Ht 5\' 7"  (1.702 m)   Wt 201 lb 6.4 oz (91.4 kg)   LMP 06/25/2022   BMI 31.54 kg/m  Urine dipstick was negative. Skin warm and dry. Has red area at top mons pubis from tick bite. Pelvic: external genitalia is normal in appearance no lesions, vagina: white discharge without odor,urethra has no lesions or masses noted, cervix: sp LEEP, irregular and everted at os, no CMT, uterus: normal size, shape and contour, non tender, no masses felt, adnexa: no masses or tenderness noted. Bladder is non tender and no masses felt. NO CVAT Fall risk is low     07/05/2022   12:03 PM 09/26/2021    2:54 PM 08/17/2020    3:09 PM  Depression screen PHQ 2/9  Decreased Interest 0 0 0  Down, Depressed, Hopeless 0 0 0  PHQ - 2 Score 0 0 0  Altered sleeping  0 0  Tired, decreased energy  0 0  Change in appetite  0 1  Feeling bad or failure about yourself   0 0  Trouble concentrating  0 0  Moving slowly or fidgety/restless  0 0  Suicidal thoughts  0 0  PHQ-9 Score  0 1     Upstream - 07/05/22 1205       Pregnancy Intention Screening   Does the patient want to become pregnant in the next year? Ok Either Way    Does the patient's partner want to become pregnant in the next year? Ok Either Way    Would the patient like to discuss contraceptive options today? No      Contraception Wrap Up   Current Method No Contraceptive Precautions    End Method No Contraception Precautions    Contraception Counseling Provided No            Examination chaperoned by Malachy Mood LPN     Assessment:     1. Abdominal discomfort UA C&S sent Will rx septra ds Meds ordered this encounter  Medications   sulfamethoxazole-trimethoprim (BACTRIM DS) 800-160 MG tablet    Sig: Take 1 tablet  by mouth 2 (two) times daily. Take 1 bid    Dispense:  14 tablet    Refill:  0    Order Specific Question:   Supervising Provider    Answer:   Duane Lope H [[id number]]    - Urine Culture - POC Urinalysis Dipstick OB - Urinalysis, Routine w reflex microscopic Call me Monday for up date If pain or fever go to Urgent Care this weekend   2. Low back pain, unspecified back pain laterality, unspecified chronicity, unspecified whether sciatica present - Urine Culture - POC Urinalysis Dipstick OB - Urinalysis, Routine w reflex microscopic  3. Fever, unspecified fever cause - Urine Culture - POC Urinalysis Dipstick OB - Urinalysis, Routine w reflex microscopic  4. Tick bite of abdomen, initial encounter No bulls eye, mildly red     Plan:    Return about 10/02/22 for physical

## 2022-07-06 LAB — URINALYSIS, ROUTINE W REFLEX MICROSCOPIC
Bilirubin, UA: NEGATIVE
Glucose, UA: NEGATIVE
Ketones, UA: NEGATIVE
Leukocytes,UA: NEGATIVE
Nitrite, UA: NEGATIVE
Protein,UA: NEGATIVE
RBC, UA: NEGATIVE
Specific Gravity, UA: 1.013 (ref 1.005–1.030)
Urobilinogen, Ur: 0.2 mg/dL (ref 0.2–1.0)
pH, UA: 6 (ref 5.0–7.5)

## 2022-07-07 LAB — URINE CULTURE: Organism ID, Bacteria: NO GROWTH

## 2022-07-09 ENCOUNTER — Other Ambulatory Visit: Payer: Self-pay | Admitting: Adult Health

## 2022-07-09 MED ORDER — DOXYCYCLINE HYCLATE 100 MG PO TABS: 100.0000 mg | ORAL_TABLET | Freq: Two times a day (BID) | ORAL | 0 refills | Status: DC

## 2022-07-09 NOTE — Progress Notes (Signed)
Will rx doxycycline for tick bite.

## 2022-09-27 IMAGING — CT CT MAXILLOFACIAL W/O CM
3 series · 15 of 47 positions shown, 18 images · non-contrast
Comparison: None.

CLINICAL DATA: Patient fell getting out of bed landing on her face.
Laceration to the bridge of the nose.



[Series 4: max soft · axial · 0.33mm/px · z∈[-86,+82]mm · 9 of 105 slices shown, 12 images]
[im 8/105  brain]
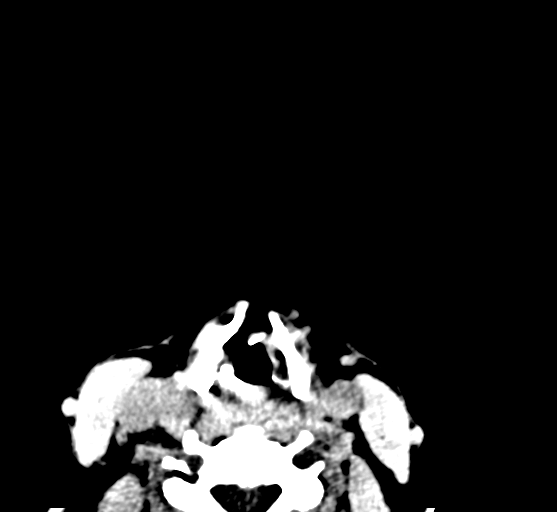
[im 8/105  bone]
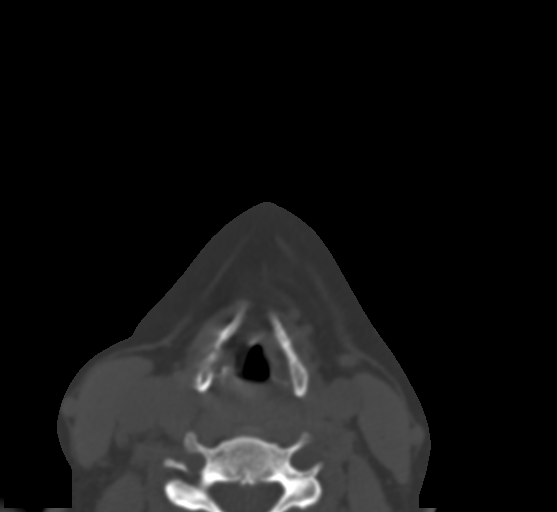
[im 18/105  bone]
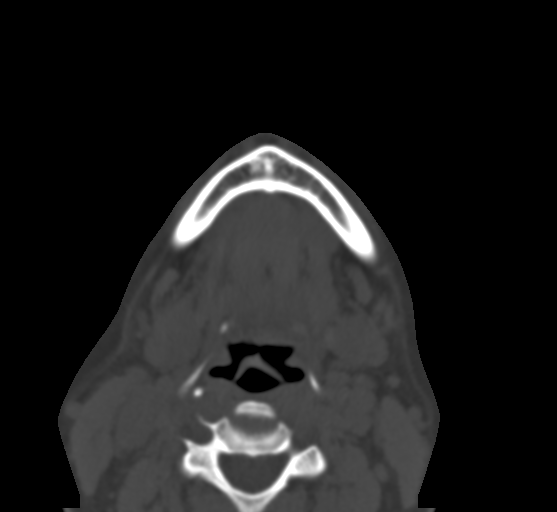
[im 29/105  bone]
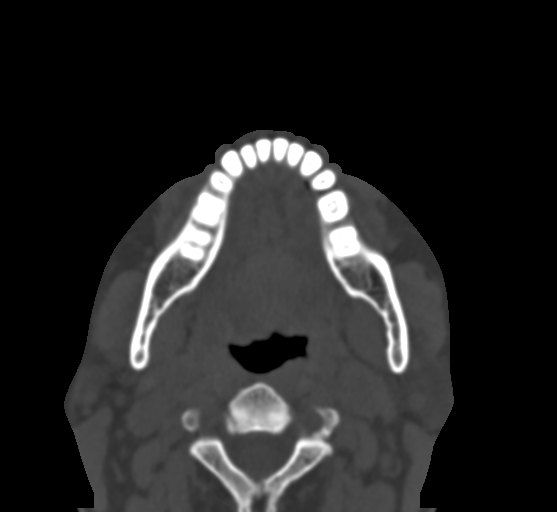
[im 40/105  bone]
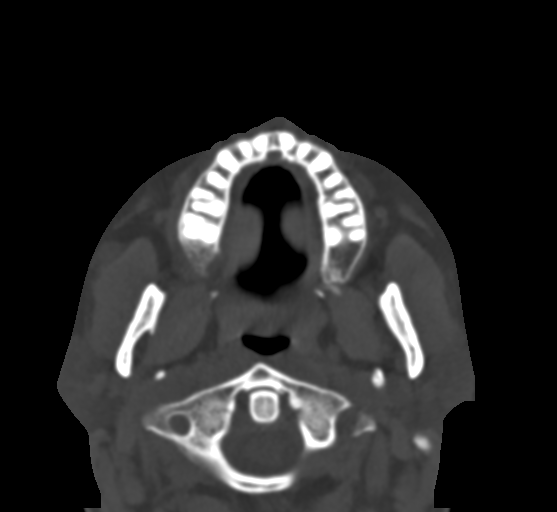
[im 51/105  brain]
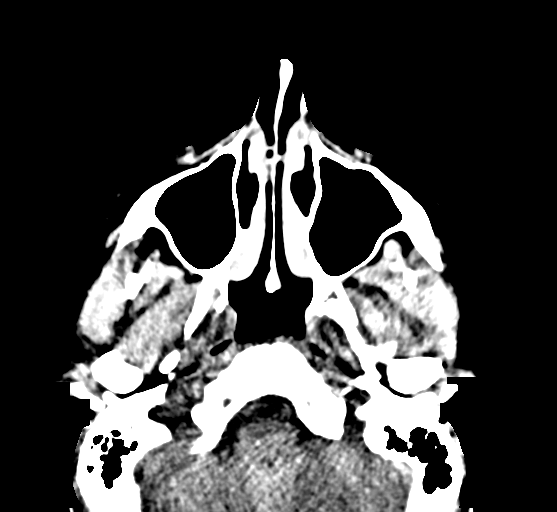
[im 51/105  bone]
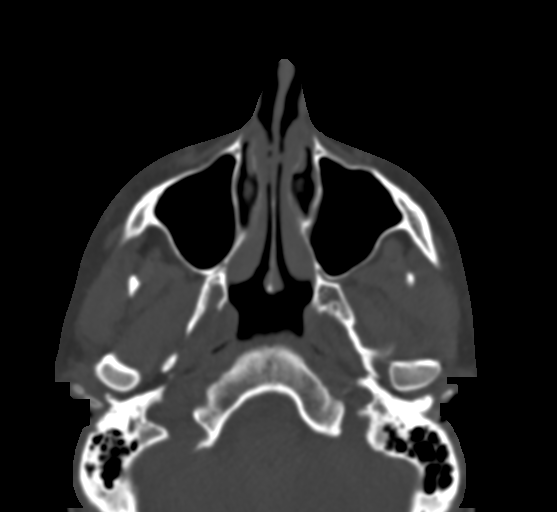
[im 61/105  bone]
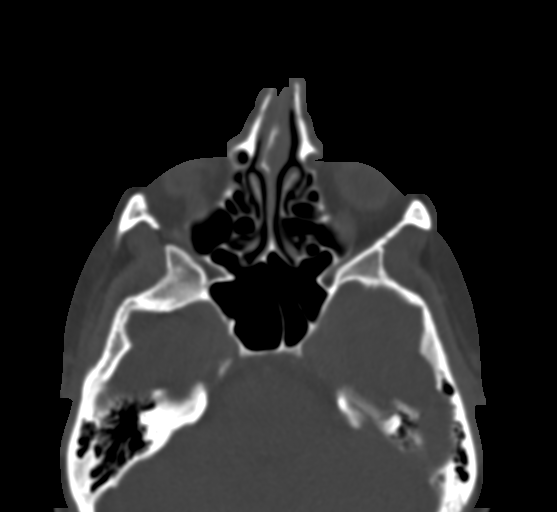
[im 72/105  bone]
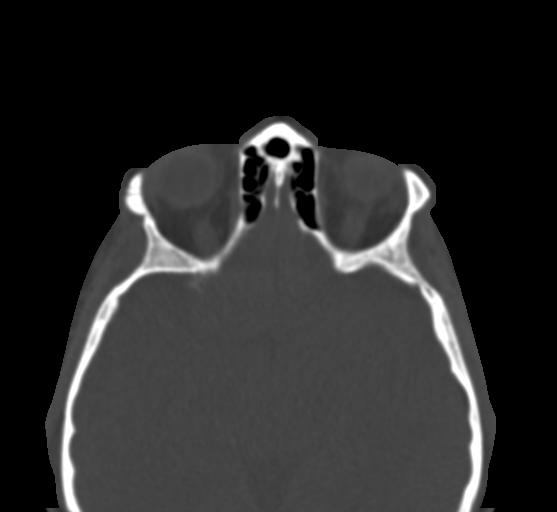
[im 83/105  bone]
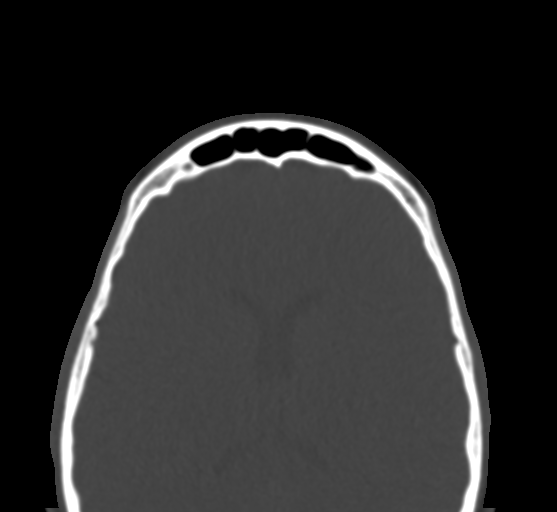
[im 94/105  brain]
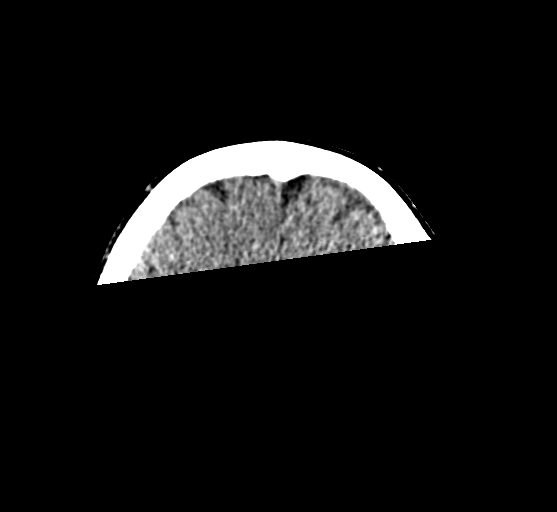
[im 94/105  bone]
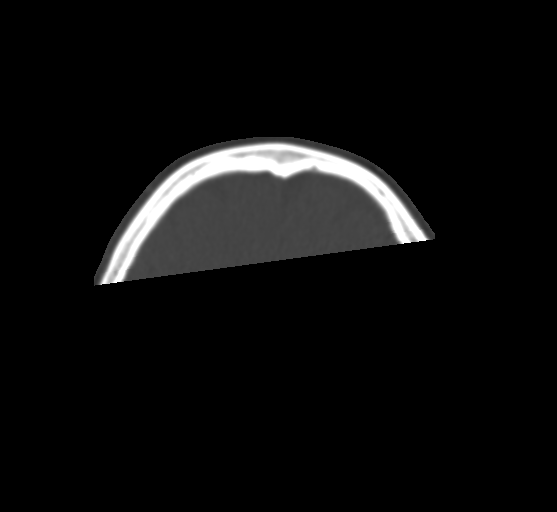

[Series 6: coronal soft · coronal · 0.40mm/px · 3 of 84 slices shown]
[im 37/84  bone]
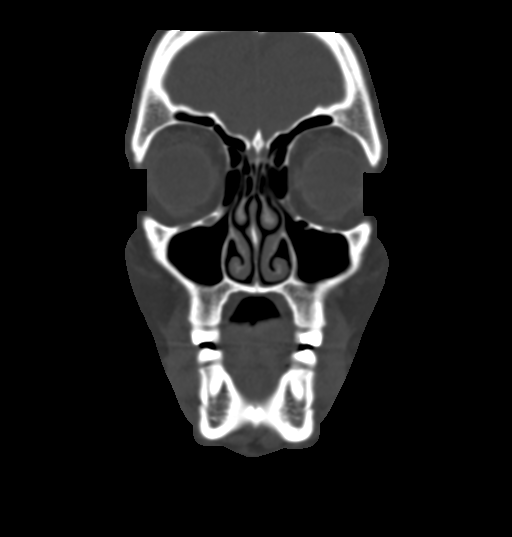
[im 47/84  bone]
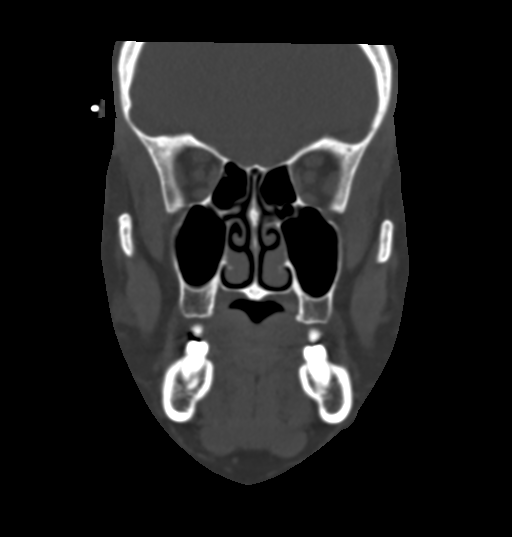
[im 56/84  bone]
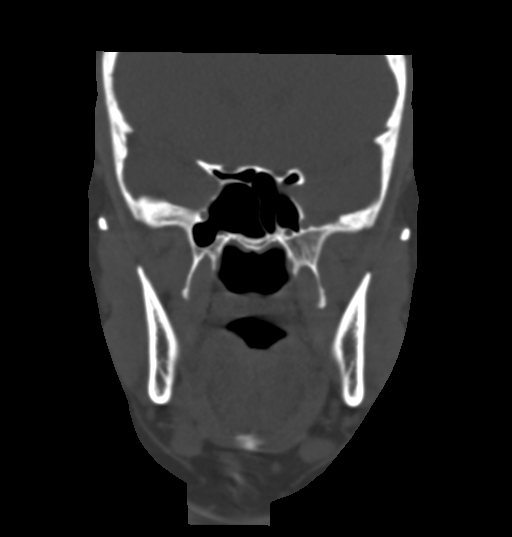

[Series 8: sagittal soft · sagittal · 0.31mm/px · 3 of 101 slices shown]
[im 34/101  bone]
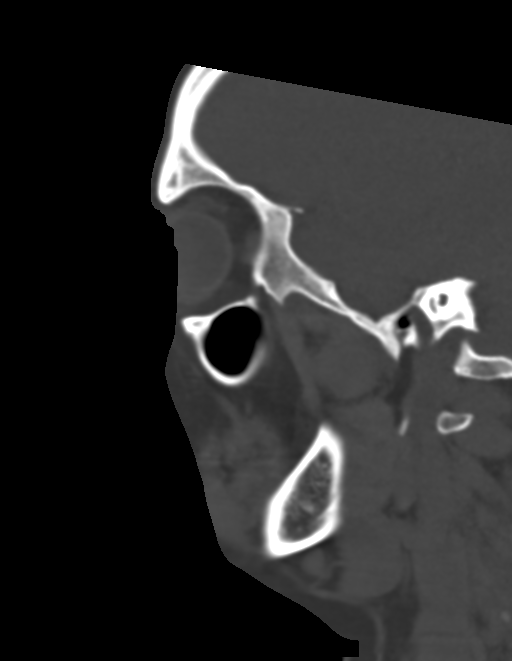
[im 51/101  bone]
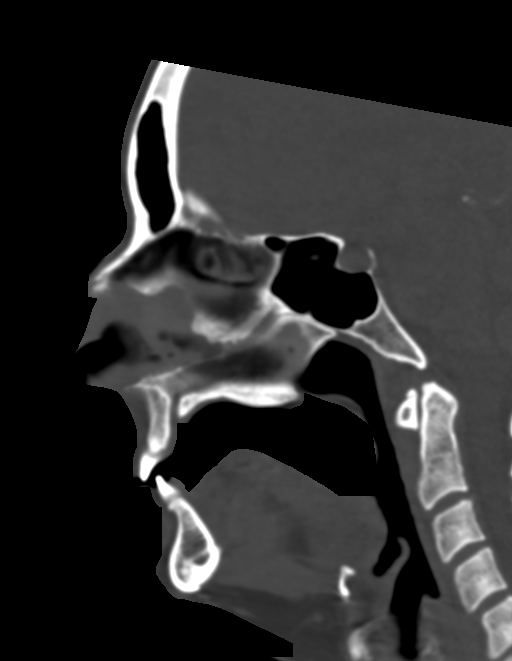
[im 67/101  bone]
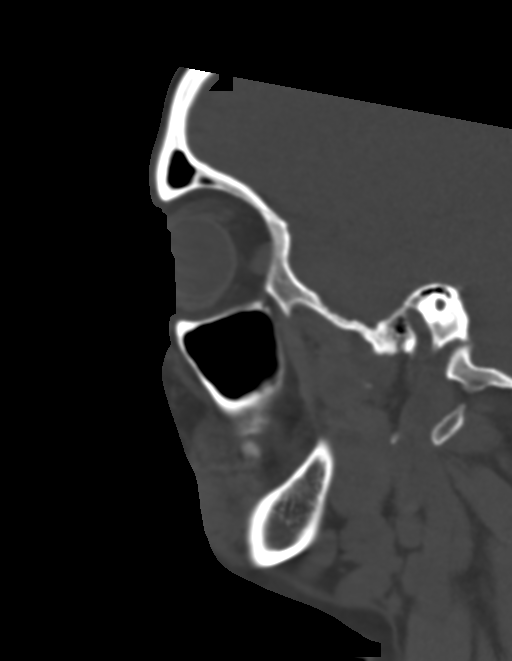

[15 of 47 positions shown; findings below may reference images not displayed]

FINDINGS: Osseous: No convincing fracture.  No bone lesion.

Orbits: Negative. No traumatic or inflammatory finding.

Sinuses: Clear.

Soft tissues: Small laceration over the anterior aspect of the nose.
No radiopaque foreign body. No other soft tissue abnormality.

Limited intracranial: No significant or unexpected finding.
IMPRESSION: 1. Soft tissue injury to the nose.  No fractures.

## 2022-10-02 ENCOUNTER — Ambulatory Visit: Payer: Managed Care, Other (non HMO) | Admitting: Adult Health

## 2022-11-20 ENCOUNTER — Ambulatory Visit: Payer: Managed Care, Other (non HMO) | Admitting: Adult Health

## 2022-12-05 ENCOUNTER — Ambulatory Visit
Admission: EM | Admit: 2022-12-05 | Discharge: 2022-12-05 | Disposition: A | Discharge status: Home / Self Care | Payer: Managed Care, Other (non HMO) | Attending: Nurse Practitioner | Admitting: Nurse Practitioner

## 2022-12-05 ENCOUNTER — Encounter: Payer: Self-pay | Admitting: Emergency Medicine

## 2022-12-05 DIAGNOSIS — H6122 Impacted cerumen, left ear: Secondary | ICD-10-CM

## 2022-12-05 NOTE — ED Provider Notes (Signed)
[facility]    CSN: [id number] Arrival date & time: 12/05/22  1552      History   Chief Complaint No chief complaint on file.   HPI Courtney Andersen is a 42 y.o. female.   The history is provided by the patient.   Presents with a 1 week history of left ear feeling clogged and muffled.  Patient reports she has been using Debrox for the past 3 to 4 days with minimal relief.  Patient denies fever, chills, headache, dizziness, or ear drainage.  Patient also reports she use an over-the-counter earwax removal kit with minimal relief.  Past Medical History:  Diagnosis Date   Abnormal Pap smear    Anemia    Fibroids 11/24/2012   History of blood transfusion 09/2014   3 units transfused at [facility]   Hx of blood transfusion reaction 09/2015   x 3 units [facility]   Menorrhagia 11/24/2012   Other and unspecified ovarian cyst 11/24/2012   Vaginal Pap smear, abnormal     Patient Active Problem List   Diagnosis Date Noted   Tick bite of abdomen 07/05/2022   Fever 07/05/2022   Low back pain 07/05/2022   Abdominal discomfort 07/05/2022   Elevated cholesterol with elevated triglycerides 09/26/2021   Encounter for screening fecal occult blood testing 09/26/2021   Encounter for well woman exam with routine gynecological exam 09/26/2021   Encounter for gynecological examination with Papanicolaou smear of cervix 08/17/2020   Encounter for surveillance of contraceptive pills 08/20/2018   History of loop electrical excision procedure (LEEP) 08/20/2018   Anemia 09/25/2015   Menorrhagia 11/24/2012   Fibroids submucous 11/24/2012   Other and unspecified ovarian cyst 11/24/2012   History of abnormal Pap smear 07/09/2012    Past Surgical History:  Procedure Laterality Date   COLPOSCOPY W/ BIOPSY / CURETTAGE     DILATATION & CURETTAGE/HYSTEROSCOPY WITH MYOSURE N/A 02/08/2016   Procedure: HYSTEROSCOPY RESECTION OF MYOMA  WITH MYOSURE;  Surgeon: Tilda Burrow, MD;   Location: [facility];  Service: Gynecology;  Laterality: N/A;   LEEP     MYOMECTOMY     WISDOM TOOTH EXTRACTION      OB History     Gravida  0   Para  0   Term  0   Preterm  0   AB  0   Living  0      SAB  0   IAB  0   Ectopic  0   Multiple  0   Live Births  0            Home Medications    Prior to Admission medications   Not on File    Family History Family History  Problem Relation Age of Onset   Hypertension Mother    Diabetes Father    Hypertension Father    Breast cancer Paternal Aunt    Cancer Paternal Aunt    Breast cancer Paternal Grandmother    Cancer Paternal Grandmother    Diabetes Brother     Social History Social History   Tobacco Use   Smoking status: Never    Passive exposure: Yes   Smokeless tobacco: Never  Vaping Use   Vaping status: Never Used  Substance Use Topics   Alcohol use: Yes    Comment: wine weekends   Drug use: No     Allergies   Sunscreens   Review of Systems Review of Systems Per HPI  Physical Exam Triage  Vital Signs ED Triage Vitals  Encounter Vitals Group     BP 12/05/22 1556 (!) 158/87     Systolic BP Percentile --      Diastolic BP Percentile --      Pulse Rate 12/05/22 1556 92     Resp 12/05/22 1556 18     Temp 12/05/22 1556 98.5 F (36.9 C)     Temp Source 12/05/22 1556 Oral     SpO2 12/05/22 1556 97 %     Weight --      Height --      Head Circumference --      Peak Flow --      Pain Score 12/05/22 1557 0     Pain Loc --      Pain Education --      Exclude from Growth Chart --    No data found.  Updated Vital Signs BP (!) 158/87 (BP Location: Right Arm)   Pulse 92   Temp 98.5 F (36.9 C) (Oral)   Resp 18   LMP 11/20/2022 (Exact Date)   SpO2 97%   Visual Acuity Right Eye Distance:   Left Eye Distance:   Bilateral Distance:    Right Eye Near:   Left Eye Near:    Bilateral Near:     Physical Exam Vitals and nursing note reviewed.  Constitutional:      General:  She is not in acute distress.    Appearance: Normal appearance.  HENT:     Head: Normocephalic.     Right Ear: Tympanic membrane, ear canal and external ear normal.     Left Ear: Ear canal and external ear normal. There is impacted cerumen.     Ears:     Comments: Ear irrigation of the left ear was performed with complete evacuation of the cerumen impaction.  Patient tolerated well.  Patient reports significant improvement    Nose: Nose normal.     Mouth/Throat:     Mouth: Mucous membranes are moist.  Eyes:     Extraocular Movements: Extraocular movements intact.     Pupils: Pupils are equal, round, and reactive to light.  Pulmonary:     Effort: Pulmonary effort is normal.  Musculoskeletal:     Cervical back: Normal range of motion.  Skin:    General: Skin is warm and dry.  Neurological:     General: No focal deficit present.     Mental Status: She is alert and oriented to person, place, and time.  Psychiatric:        Mood and Affect: Mood normal.        Behavior: Behavior normal.      UC Treatments / Results  Labs (all labs ordered are listed, but only abnormal results are displayed) Labs Reviewed - No data to display  EKG   Radiology No results found.  Procedures Procedures (including critical care time)  Medications Ordered in UC Medications - No data to display  Initial Impression / Assessment and Plan / UC Course  I have reviewed the triage vital signs and the nursing notes.  Pertinent labs & imaging results that were available during my care of the patient were reviewed by me and considered in my medical decision making (see chart for details).  Ear irrigation was performed with complete evacuation of the cerumen.  Patient reports improvement of her hearing after ear irrigation.  Supportive care recommendations were provided and discussed with the patient to include continuing use of Debrox, warm  compresses to the ear as needed.  Patient is in agreement with  this plan of care and verbalizes understanding.  All questions were answered.  Patient stable for discharge.   Final Clinical Impressions(s) / UC Diagnoses   Final diagnoses:  Impacted cerumen, left ear     Discharge Instructions      Successful left ear irrigation. Continue use of Debrox as needed. May take over-the-counter Tylenol or ibuprofen as needed for pain or discomfort. Avoid sticking or inserting anything inside of the ear. Follow-up as needed.     ED Prescriptions   None    PDMP not reviewed this encounter.   Abran Cantor, NP 12/05/22 [phone]

## 2022-12-05 NOTE — Discharge Instructions (Addendum)
Successful left ear irrigation. Continue use of Debrox as needed. May take over-the-counter Tylenol or ibuprofen as needed for pain or discomfort. Avoid sticking or inserting anything inside of the ear. Follow-up as needed.

## 2022-12-05 NOTE — ED Triage Notes (Signed)
Left ear is muffled x 1 week.  Has used debrox since Monday and has not noticed a change.

## 2022-12-11 ENCOUNTER — Encounter: Payer: Self-pay | Admitting: Adult Health

## 2022-12-30 ENCOUNTER — Ambulatory Visit: Payer: Managed Care, Other (non HMO) | Admitting: Adult Health

## 2022-12-31 ENCOUNTER — Encounter: Payer: Self-pay | Admitting: Family Medicine

## 2022-12-31 ENCOUNTER — Ambulatory Visit: Payer: Managed Care, Other (non HMO) | Admitting: Family Medicine

## 2022-12-31 VITALS — BP 133/77 | HR 73 | Ht 67.0 in | Wt 203.0 lb

## 2022-12-31 DIAGNOSIS — D649 Anemia, unspecified: Secondary | ICD-10-CM

## 2022-12-31 DIAGNOSIS — Z23 Encounter for immunization: Secondary | ICD-10-CM | POA: Diagnosis not present

## 2022-12-31 DIAGNOSIS — R7301 Impaired fasting glucose: Secondary | ICD-10-CM

## 2022-12-31 DIAGNOSIS — E559 Vitamin D deficiency, unspecified: Secondary | ICD-10-CM

## 2022-12-31 DIAGNOSIS — E7849 Other hyperlipidemia: Secondary | ICD-10-CM

## 2022-12-31 DIAGNOSIS — Z114 Encounter for screening for human immunodeficiency virus [HIV]: Secondary | ICD-10-CM

## 2022-12-31 DIAGNOSIS — Z1159 Encounter for screening for other viral diseases: Secondary | ICD-10-CM

## 2022-12-31 DIAGNOSIS — E038 Other specified hypothyroidism: Secondary | ICD-10-CM

## 2022-12-31 NOTE — Assessment & Plan Note (Signed)
Pending cbc Asymptomatic in the clinic

## 2022-12-31 NOTE — Patient Instructions (Signed)
I appreciate the opportunity to provide care to you today!    Follow up:  5 months  Labs: please stop by the lab during the week to get your blood drawn (CBC, CMP, TSH, Lipid profile, HgA1c, Vit D)  Screening: HIV and Hep C   Attached with your AVS, you will find valuable resources for self-education. I highly recommend dedicating some time to thoroughly examine them.   Please continue to a heart-healthy diet and increase your physical activities. Try to exercise for at least five days a week.    It was a pleasure to see you and I look forward to continuing to work together on your health and well-being. Please do not hesitate to call the office if you need care or have questions about your care.  In case of emergency, please visit the Emergency Department for urgent care, or contact our clinic at [phone] to schedule an appointment. We're here to help you!   Have a wonderful day and week. With Gratitude, Gilmore Laroche MSN, FNP-BC

## 2022-12-31 NOTE — Assessment & Plan Note (Signed)
Patient educated on CDC recommendation for the vaccine. Verbal consent was obtained from the patient, vaccine administered by nurse, no sign of adverse reactions noted at this time. Patient education on arm soreness and use of tylenol or ibuprofen for this patient  was discussed. Patient educated on the signs and symptoms of adverse effect and advise to contact the office if they occur.  

## 2022-12-31 NOTE — Progress Notes (Signed)
New Patient Office Visit  Subjective:  Patient ID: Courtney Andersen, female    DOB: May 13, 1980  Age: 42 y.o. MRN: [id number]  CC:  Chief Complaint  Patient presents with   New Patient (Initial Visit)    Establishing care, would like to have a general check up with lab work, has not had a pcp before, seen by obgyn only.     HPI Courtney Andersen is a 42 y.o. female with past medical history of anemia presents for establishing care. For the details of today's visit, please refer to the assessment and plan.     Past Medical History:  Diagnosis Date   Abnormal Pap smear    Anemia    Fibroids 11/24/2012   History of blood transfusion 09/2014   3 units transfused at [facility]   Hx of blood transfusion reaction 09/2015   x 3 units [facility]   Menorrhagia 11/24/2012   Other and unspecified ovarian cyst 11/24/2012   Vaginal Pap smear, abnormal     Past Surgical History:  Procedure Laterality Date   COLPOSCOPY W/ BIOPSY / CURETTAGE     DILATATION & CURETTAGE/HYSTEROSCOPY WITH MYOSURE N/A 02/08/2016   Procedure: HYSTEROSCOPY RESECTION OF MYOMA  WITH MYOSURE;  Surgeon: Tilda Burrow, MD;  Location: [facility];  Service: Gynecology;  Laterality: N/A;   LEEP     MYOMECTOMY     WISDOM TOOTH EXTRACTION      Family History  Problem Relation Age of Onset   Hypertension Mother    Diabetes Father    Hypertension Father    Breast cancer Paternal Aunt    Cancer Paternal Aunt    Breast cancer Paternal Grandmother    Cancer Paternal Grandmother    Diabetes Brother     Social History   Socioeconomic History   Marital status: Married    Spouse name: Not on file   Number of children: 0   Years of education: Not on file   Highest education level: Master's degree (e.g., MA, MS, MEng, MEd, MSW, MBA)  Occupational History   Not on file  Tobacco Use   Smoking status: Never    Passive exposure: Yes   Smokeless tobacco: Never  Vaping Use   Vaping status: Never Used  Substance and  Sexual Activity   Alcohol use: Yes    Comment: wine weekends   Drug use: No   Sexual activity: Yes    Partners: Male  Other Topics Concern   Not on file  Social History Narrative   Not on file   Social Determinants of Health   Financial Resource Strain: Low Risk  (12/27/2022)   Overall Financial Resource Strain (CARDIA)    Difficulty of Paying Living Expenses: Not hard at all  Food Insecurity: No Food Insecurity (12/27/2022)   Hunger Vital Sign    Worried About Running Out of Food in the Last Year: Never true    Ran Out of Food in the Last Year: Never true  Transportation Needs: No Transportation Needs (12/27/2022)   PRAPARE - Administrator, Civil Service (Medical): No    Lack of Transportation (Non-Medical): No  Physical Activity: Sufficiently Active (12/27/2022)   Exercise Vital Sign    Days of Exercise per Week: 4 days    Minutes of Exercise per Session: 50 min  Stress: No Stress Concern Present (12/27/2022)   [employer] of Occupational Health - Occupational Stress Questionnaire    Feeling of Stress : Not at all  Social  Connections: Moderately Isolated (12/27/2022)   Social Connection and Isolation Panel [NHANES]    Frequency of Communication with Friends and Family: Three times a week    Frequency of Social Gatherings with Friends and Family: More than three times a week    Attends Religious Services: Never    Database administrator or Organizations: No    Attends Engineer, structural: Not on file    Marital Status: Married  Catering manager Violence: Not At Risk (09/26/2021)   Humiliation, Afraid, Rape, and Kick questionnaire    Fear of Current or Ex-Partner: No    Emotionally Abused: No    Physically Abused: No    Sexually Abused: No    ROS Review of Systems  Constitutional:  Negative for chills and fever.  Eyes:  Negative for visual disturbance.  Respiratory:  Negative for chest tightness and shortness of breath.   Neurological:   Negative for dizziness and headaches.    Objective:   Today's Vitals: BP 133/77   Pulse 73   Ht 5\' 7"  (1.702 m)   Wt 203 lb (92.1 kg)   LMP 11/20/2022 (Exact Date)   SpO2 98%   BMI 31.79 kg/m   Physical Exam HENT:     Head: Normocephalic.     Mouth/Throat:     Mouth: Mucous membranes are moist.  Cardiovascular:     Rate and Rhythm: Normal rate.     Heart sounds: Normal heart sounds.  Pulmonary:     Effort: Pulmonary effort is normal.     Breath sounds: Normal breath sounds.  Neurological:     Mental Status: She is alert.      Assessment & Plan:   Anemia, unspecified type Assessment & Plan: Pending cbc Asymptomatic in the clinic   Encounter for immunization Assessment & Plan: Patient educated on CDC recommendation for the vaccine. Verbal consent was obtained from the patient, vaccine administered by nurse, no sign of adverse reactions noted at this time. Patient education on arm soreness and use of tylenol or ibuprofen for this patient  was discussed. Patient educated on the signs and symptoms of adverse effect and advise to contact the office if they occur.   Orders: -     Flu vaccine trivalent PF, 6mos and older(Flulaval,Afluria,Fluarix,Fluzone)  IFG (impaired fasting glucose) -     Hemoglobin A1c  Vitamin D deficiency -     VITAMIN D 25 Hydroxy (Vit-D Deficiency, Fractures)  Need for hepatitis C screening test -     Hepatitis C antibody  Encounter for screening for HIV -     HIV Antibody (routine testing w rflx)  TSH (thyroid-stimulating hormone deficiency) -     TSH + free T4  Other hyperlipidemia -     Lipid panel -     CMP14+EGFR -     CBC with Differential/Platelet  Note: This chart has been completed using Engineer, civil (consulting) software, and while attempts have been made to ensure accuracy, certain words and phrases may not be transcribed as intended.     Follow-up: Return in about 5 months (around 05/31/2023).   Gilmore Laroche, FNP

## 2023-01-08 ENCOUNTER — Encounter: Payer: Self-pay | Admitting: Adult Health

## 2023-01-08 ENCOUNTER — Ambulatory Visit: Payer: Managed Care, Other (non HMO) | Admitting: Adult Health

## 2023-01-08 VITALS — BP 124/85 | HR 74 | Ht 67.0 in | Wt 206.5 lb

## 2023-01-08 DIAGNOSIS — Z1211 Encounter for screening for malignant neoplasm of colon: Secondary | ICD-10-CM

## 2023-01-08 DIAGNOSIS — Z01419 Encounter for gynecological examination (general) (routine) without abnormal findings: Secondary | ICD-10-CM | POA: Diagnosis not present

## 2023-01-08 DIAGNOSIS — Z1231 Encounter for screening mammogram for malignant neoplasm of breast: Secondary | ICD-10-CM | POA: Diagnosis not present

## 2023-01-08 DIAGNOSIS — Z1331 Encounter for screening for depression: Secondary | ICD-10-CM

## 2023-01-08 LAB — HEMOCCULT GUIAC POC 1CARD (OFFICE): Fecal Occult Blood, POC: NEGATIVE

## 2023-01-08 NOTE — Progress Notes (Signed)
Patient ID: Courtney Andersen, female   DOB: Oct 15, 1980, 42 y.o.   MRN: [id number] History of Present Illness: Courtney Andersen is a 42 year old white female, married, G0P0, in for a well woman gyn exam, no complaints.     Component Value Date/Time   DIAGPAP  08/17/2020 1516    - Negative for intraepithelial lesion or malignancy (NILM)   DIAGPAP  08/22/2017 0000    NEGATIVE FOR INTRAEPITHELIAL LESIONS OR MALIGNANCY.   DIAGPAP  08/21/2016 0000    NEGATIVE FOR INTRAEPITHELIAL LESIONS OR MALIGNANCY.   HPVHIGH Negative 08/17/2020 1516   ADEQPAP  08/17/2020 1516    Satisfactory for evaluation; transformation zone component PRESENT.   ADEQPAP  08/22/2017 0000    Satisfactory for evaluation  endocervical/transformation zone component PRESENT.   ADEQPAP  08/21/2016 0000    Satisfactory for evaluation  endocervical/transformation zone component PRESENT.    PCP is Gilmore Laroche NP   Current Medications, Allergies, Past Medical History, Past Surgical History, Family History and Social History were reviewed in [employer] record.     Review of Systems: Patient denies any headaches, hearing loss, fatigue, blurred vision, shortness of breath, chest pain, abdominal pain, problems with bowel movements, urination, or intercourse. No joint pain or mood swings.  Periods manageable she says     Physical Exam:BP 124/85 (BP Location: Left Arm, Patient Position: Sitting, Cuff Size: Normal)   Pulse 74   Ht 5\' 7"  (1.702 m)   Wt 206 lb 8 oz (93.7 kg)   LMP 12/15/2022   BMI 32.34 kg/m   General:  Well developed, well nourished, no acute distress Skin:  Warm and dry Neck:  Midline trachea, normal thyroid, good ROM, no lymphadenopathy Lungs; Clear to auscultation bilaterally Breast:  No dominant palpable mass, retraction, or nipple discharge Cardiovascular: Regular rate and rhythm Abdomen:  Soft, non tender, no hepatosplenomegaly Pelvic:  External genitalia is normal in appearance, no  lesions.  The vagina is normal in appearance. Urethra has no lesions or masses. The cervix is smooth.  Uterus is felt to be normal size, shape, and contour.  No adnexal masses or tenderness noted.Bladder is non tender, no masses felt. Rectal: Good sphincter tone, no polyps, or hemorrhoids felt.  Hemoccult negative.had stool in rectal vault Extremities/musculoskeletal:  No swelling or varicosities noted, no clubbing or cyanosis Psych:  No mood changes, alert and cooperative,seems happy AA is 2 Fall risk is low    01/08/2023    2:54 PM 12/31/2022    9:45 AM 07/05/2022   12:03 PM  Depression screen PHQ 2/9  Decreased Interest 0 0 0  Down, Depressed, Hopeless 0 0 0  PHQ - 2 Score 0 0 0  Altered sleeping 0 0   Tired, decreased energy 0 0   Change in appetite 0 0   Feeling bad or failure about yourself  0 0   Trouble concentrating 0 0   Moving slowly or fidgety/restless 0 0   Suicidal thoughts 0 0   PHQ-9 Score 0 0   Difficult doing work/chores  Not difficult at all        01/08/2023    2:54 PM 12/31/2022    9:45 AM 09/26/2021    2:55 PM 08/17/2020    3:09 PM  GAD 7 : Generalized Anxiety Score  Nervous, Anxious, on Edge 0 0 1 0  Control/stop worrying 0 0 0 0  Worry too much - different things 0 0 0 0  Trouble relaxing 0 0 0 0  Restless 0 0 0 0  Easily annoyed or irritable 0 0 1 0  Afraid - awful might happen 0 0 0 0  Total GAD 7 Score 0 0 2 0  Anxiety Difficulty  Not difficult at all        Upstream - 01/08/23 1459       Pregnancy Intention Screening   Does the patient want to become pregnant in the next year? Ok Either Way    Does the patient's partner want to become pregnant in the next year? Ok Either Way    Would the patient like to discuss contraceptive options today? No      Contraception Wrap Up   Current Method No Method - Other Reason    End Method No Method - Other Reason    Contraception Counseling Provided No             Examination chaperoned by Malachy Mood LPN   Impression and plan: 1. Encounter for well woman exam with routine gynecological exam Pap and physical in 1 year Labs with PCP Had negative mammogram 11/15/21 Colonoscopy or cologuard at 46   2. Encounter for screening fecal occult blood testing Hemoccult was negative  - POCT occult blood stool  3. Screening mammogram for breast cancer Pt to call for mammogram  - MM 3D SCREENING MAMMOGRAM BILATERAL BREAST; Future

## 2023-01-18 LAB — LIPID PANEL
Chol/HDL Ratio: 4.1 ratio (ref 0.0–4.4)
Cholesterol, Total: 204 mg/dL — ABNORMAL HIGH (ref 100–199)
HDL: 50 mg/dL (ref >39)
LDL Chol Calc (NIH): 116 mg/dL — ABNORMAL HIGH (ref 0–99)
Triglycerides: 218 mg/dL — ABNORMAL HIGH (ref 0–149)
VLDL Cholesterol Cal: 38 mg/dL (ref 5–40)

## 2023-01-18 LAB — CBC WITH DIFFERENTIAL/PLATELET
Basophils Absolute: 0 10*3/uL (ref 0.0–0.2)
Basos: 0 %
EOS (ABSOLUTE): 0.1 10*3/uL (ref 0.0–0.4)
Eos: 1 %
Hematocrit: 42 % (ref 34.0–46.6)
Hemoglobin: 14.1 g/dL (ref 11.1–15.9)
Immature Grans (Abs): 0 10*3/uL (ref 0.0–0.1)
Immature Granulocytes: 0 %
Lymphocytes Absolute: 1.4 10*3/uL (ref 0.7–3.1)
Lymphs: 21 %
MCH: 31.5 pg (ref 26.6–33.0)
MCHC: 33.6 g/dL (ref 31.5–35.7)
MCV: 94 fL (ref 79–97)
Monocytes Absolute: 0.4 10*3/uL (ref 0.1–0.9)
Monocytes: 6 %
Neutrophils Absolute: 4.9 10*3/uL (ref 1.4–7.0)
Neutrophils: 72 %
Platelets: 266 10*3/uL (ref 150–450)
RBC: 4.48 x10E6/uL (ref 3.77–5.28)
RDW: 12 % (ref 11.7–15.4)
WBC: 6.8 10*3/uL (ref 3.4–10.8)

## 2023-01-18 LAB — CMP14+EGFR
ALT: 18 [IU]/L (ref 0–32)
AST: 17 [IU]/L (ref 0–40)
Albumin: 4.2 g/dL (ref 3.9–4.9)
Alkaline Phosphatase: 70 [IU]/L (ref 44–121)
BUN/Creatinine Ratio: 16 (ref 9–23)
BUN: 12 mg/dL (ref 6–24)
Bilirubin Total: 1.1 mg/dL (ref 0.0–1.2)
CO2: 24 mmol/L (ref 20–29)
Calcium: 9.1 mg/dL (ref 8.7–10.2)
Chloride: 103 mmol/L (ref 96–106)
Creatinine, Ser: 0.73 mg/dL (ref 0.57–1.00)
Globulin, Total: 2.3 g/dL (ref 1.5–4.5)
Glucose: 90 mg/dL (ref 70–99)
Potassium: 4.5 mmol/L (ref 3.5–5.2)
Sodium: 139 mmol/L (ref 134–144)
Total Protein: 6.5 g/dL (ref 6.0–8.5)
eGFR: 106 mL/min/{1.73_m2} (ref >59)

## 2023-01-18 LAB — TSH+FREE T4
Free T4: 1.03 ng/dL (ref 0.82–1.77)
TSH: 1.16 u[IU]/mL (ref 0.450–4.500)

## 2023-01-18 LAB — HEMOGLOBIN A1C
Est. average glucose Bld gHb Est-mCnc: 103 mg/dL
Hgb A1c MFr Bld: 5.2 % (ref 4.8–5.6)

## 2023-01-18 LAB — HIV ANTIBODY (ROUTINE TESTING W REFLEX): HIV Screen 4th Generation wRfx: NONREACTIVE

## 2023-01-18 LAB — VITAMIN D 25 HYDROXY (VIT D DEFICIENCY, FRACTURES): Vit D, 25-Hydroxy: 22.6 ng/mL — ABNORMAL LOW (ref 30.0–100.0)

## 2023-01-18 LAB — HEPATITIS C ANTIBODY: Hep C Virus Ab: NONREACTIVE

## 2023-01-23 ENCOUNTER — Other Ambulatory Visit: Payer: Self-pay | Admitting: Family Medicine

## 2023-01-23 DIAGNOSIS — E559 Vitamin D deficiency, unspecified: Secondary | ICD-10-CM

## 2023-01-23 MED ORDER — VITAMIN D (ERGOCALCIFEROL) 1.25 MG (50000 UNIT) PO CAPS
50000.0000 [IU] | ORAL_CAPSULE | ORAL | 1 refills | Status: AC
Start: 2023-01-23 — End: ?

## 2023-02-11 ENCOUNTER — Other Ambulatory Visit (HOSPITAL_COMMUNITY): Payer: Self-pay | Admitting: Adult Health

## 2023-02-11 DIAGNOSIS — Z1231 Encounter for screening mammogram for malignant neoplasm of breast: Secondary | ICD-10-CM

## 2023-02-14 ENCOUNTER — Encounter (HOSPITAL_COMMUNITY): Payer: Self-pay

## 2023-02-14 ENCOUNTER — Ambulatory Visit (HOSPITAL_COMMUNITY)
Admission: RE | Admit: 2023-02-14 | Discharge: 2023-02-14 | Disposition: A | Discharge status: Home / Self Care | Payer: Managed Care, Other (non HMO) | Source: Ambulatory Visit | Attending: Adult Health | Admitting: Adult Health

## 2023-02-14 ENCOUNTER — Inpatient Hospital Stay (HOSPITAL_COMMUNITY): Admission: RE | Admit: 2023-02-14 | Payer: Managed Care, Other (non HMO) | Source: Ambulatory Visit

## 2023-02-14 DIAGNOSIS — Z1231 Encounter for screening mammogram for malignant neoplasm of breast: Secondary | ICD-10-CM

## 2023-06-06 ENCOUNTER — Encounter: Payer: Self-pay | Admitting: Family Medicine

## 2023-06-06 ENCOUNTER — Ambulatory Visit: Payer: Managed Care, Other (non HMO) | Admitting: Family Medicine

## 2023-06-06 VITALS — BP 120/80 | HR 67 | Resp 16 | Ht 67.0 in | Wt 208.1 lb

## 2023-06-06 DIAGNOSIS — R7301 Impaired fasting glucose: Secondary | ICD-10-CM

## 2023-06-06 DIAGNOSIS — M722 Plantar fascial fibromatosis: Secondary | ICD-10-CM

## 2023-06-06 DIAGNOSIS — E559 Vitamin D deficiency, unspecified: Secondary | ICD-10-CM

## 2023-06-06 DIAGNOSIS — E038 Other specified hypothyroidism: Secondary | ICD-10-CM | POA: Diagnosis not present

## 2023-06-06 DIAGNOSIS — E7849 Other hyperlipidemia: Secondary | ICD-10-CM

## 2023-06-06 NOTE — Assessment & Plan Note (Signed)
 Encouraged rest and activity modification. Advised ice therapy for 15-20 minutes, up to 4 times daily. Recommended wearing sturdy, supportive shoes with good arch and heel support. Encouraged daily exercises to relieve heel pain. Advised use of over-the-counter NSAIDs to reduce pain and inflammation. Will consider referral to orthopedic surgery if no improvement is noted after 6-8 weeks.

## 2023-06-06 NOTE — Patient Instructions (Addendum)
 I appreciate the opportunity to provide care to you today!    Follow up:  4 months  Labs: please stop by the lab 2-3 days before your next appt to get your blood drawn (CBC, CMP, TSH, Lipid profile, HgA1c, Vit D)  Plantar Fasciitis - Treatment Plan  Rest and Activity Modification Rest your foot to promote healing, but avoid complete inactivity, which may worsen stiffness and pain. Limit aggravating activities such as prolonged standing or running. Switch to low-impact exercises like swimming or cycling.  Ice Therapy Apply ice to the heel for 15-20 minutes, up to 4 times daily, especially after activity. Always use a towel between the ice and your skin. Icing and gentle massage before exercise may also be beneficial. Footwear & Inserts Wear sturdy, supportive shoes with good arch and heel support. Avoid walking barefoot, especially on hard surfaces. Consider padded or gel heel inserts or orthotics (over-the-counter or custom). Use silicone heel inserts to reduce pressure on the heel. Stretching & Exercises Perform daily exercises to relieve heel pain and improve flexibility: Calf stretches Plantar fascia stretches (especially before getting out of bed) Towel stretch or wall calf stretch (3-5 times/day) Heel raises Seated calf stretch Ankle circles Toe curls Foot taping  Medications Use over-the-counter NSAIDs such as ibuprofen (Advil, Motrin) or naproxen (Aleve) to relieve pain and inflammation. Night Splints Wearing a night splint to keep the foot flexed while sleeping may help reduce morning pain. These can be purchased at most pharmacies or medical supply stores.  If No Improvement After 6-8 Weeks Refer to physical therapy for targeted rehabilitation. Consider corticosteroid injection for persistent inflammation with orthopedic surgery .   Attached with your AVS, you will find valuable resources for self-education. I highly recommend dedicating some time to thoroughly  examine them.   Please continue to a heart-healthy diet and increase your physical activities. Try to exercise for at least five days a week.    It was a pleasure to see you and I look forward to continuing to work together on your health and well-being. Please do not hesitate to call the office if you need care or have questions about your care.  In case of emergency, please visit the Emergency Department for urgent care, or contact our clinic at [phone] to schedule an appointment. We're here to help you!   Have a wonderful day and week. With Gratitude, Gilmore Laroche MSN, FNP-BC

## 2023-06-06 NOTE — Progress Notes (Signed)
 Established Patient Office Visit  Subjective:  Patient ID: Courtney Andersen, female    DOB: 1980/06/17  Age: 43 y.o. MRN: [id number]  CC:  Chief Complaint  Patient presents with   Foot Pain    Pain in feet when she gets up to walk if she has been sitting or inactive for a while. Hurts on both feet across the top, sometimes sharp like a bee sting but it doesn't last long. Wants to know if there are exercises she can do to help with the pain     HPI Courtney Andersen is a 43 y.o. female presents with the above complaints, which have been ongoing for a year. No recent injury or trauma reported.  Past Medical History:  Diagnosis Date   Abnormal Pap smear    Anemia    Fibroids 11/24/2012   History of blood transfusion 09/2014   3 units transf[facility]   Hx of blood transfusion reaction 09/2015   x 3 units [facility]   Menorrhagia 11/24/2012   Other and unspecified ovarian cyst 11/24/2012   Vaginal Pap smear, abnormal     Past Surgical History:  Procedure Laterality Date   COLPOSCOPY W/ BIOPSY / CURETTAGE     DILATATION & CURETTAGE/HYSTEROSCOPY WITH MYOSURE N/A 02/08/2016   Procedure: HYSTEROSCOPY RESECTION OF MYOMA  WITH MYOSURE;  Surgeon: Tilda Burrow, MD;  Location: [facility];  Service: Gynecology;  Laterality: N/A;   LEEP     MYOMECTOMY     WISDOM TOOTH EXTRACTION      Family History  Problem Relation Age of Onset   Hypertension Mother    Diabetes Father    Hypertension Father    Breast cancer Paternal Aunt    Cancer Paternal Aunt    Breast cancer Paternal Grandmother    Cancer Paternal Grandmother    Diabetes Brother     Social History   Socioeconomic History   Marital status: Married    Spouse name: Not on file   Number of children: 0   Years of education: Not on file   Highest education level: Master's degree (e.g., MA, MS, MEng, MEd, MSW, MBA)  Occupational History   Not on file  Tobacco Use   Smoking status: Never    Passive exposure: Yes    Smokeless tobacco: Never  Vaping Use   Vaping status: Never Used  Substance and Sexual Activity   Alcohol use: Yes    Comment: wine weekends   Drug use: No   Sexual activity: Yes    Partners: Male    Birth control/protection: None  Other Topics Concern   Not on file  Social History Narrative   Not on file   Social Drivers of Health   Financial Resource Strain: Low Risk  (06/02/2023)   Overall Financial Resource Strain (CARDIA)    Difficulty of Paying Living Expenses: Not hard at all  Food Insecurity: No Food Insecurity (06/02/2023)   Hunger Vital Sign    Worried About Running Out of Food in the Last Year: Never true    Ran Out of Food in the Last Year: Never true  Transportation Needs: No Transportation Needs (06/02/2023)   PRAPARE - Administrator, Civil Service (Medical): No    Lack of Transportation (Non-Medical): No  Physical Activity: Sufficiently Active (06/02/2023)   Exercise Vital Sign    Days of Exercise per Week: 4 days    Minutes of Exercise per Session: 40 min  Stress: No Stress Concern  Present (06/02/2023)   [employer] of Occupational Health - Occupational Stress Questionnaire    Feeling of Stress : Not at all  Social Connections: Moderately Isolated (06/02/2023)   Social Connection and Isolation Panel [NHANES]    Frequency of Communication with Friends and Family: More than three times a week    Frequency of Social Gatherings with Friends and Family: Twice a week    Attends Religious Services: Never    Database administrator or Organizations: No    Attends Banker Meetings: Never    Marital Status: Married  Catering manager Violence: Not At Risk (01/08/2023)   Humiliation, Afraid, Rape, and Kick questionnaire    Fear of Current or Ex-Partner: No    Emotionally Abused: No    Physically Abused: No    Sexually Abused: No    Outpatient Medications Prior to Visit  Medication Sig Dispense Refill   Vitamin D, Ergocalciferol,  (DRISDOL) 1.25 MG (50000 UNIT) CAPS capsule Take 1 capsule (50,000 Units total) by mouth every 7 (seven) days. 20 capsule 1   No facility-administered medications prior to visit.    Allergies  Allergen Reactions   Sunscreens Hives    ROS Review of Systems  Constitutional:  Negative for chills and fever.  Eyes:  Negative for visual disturbance.  Respiratory:  Negative for chest tightness and shortness of breath.   Musculoskeletal:        Foot pain bil  Neurological:  Negative for dizziness and headaches.      Objective:    Physical Exam HENT:     Head: Normocephalic.     Mouth/Throat:     Mouth: Mucous membranes are moist.  Cardiovascular:     Rate and Rhythm: Normal rate.     Heart sounds: Normal heart sounds.  Pulmonary:     Effort: Pulmonary effort is normal.     Breath sounds: Normal breath sounds.  Neurological:     Mental Status: She is alert.     BP 120/80   Pulse 67   Resp 16   Ht 5\' 7"  (1.702 m)   Wt 208 lb 1.9 oz (94.4 kg)   SpO2 97%   BMI 32.60 kg/m  Wt Readings from Last 3 Encounters:  06/06/23 208 lb 1.9 oz (94.4 kg)  01/08/23 206 lb 8 oz (93.7 kg)  12/31/22 203 lb (92.1 kg)    Lab Results  Component Value Date   TSH 1.160 01/17/2023   Lab Results  Component Value Date   WBC 6.8 01/17/2023   HGB 14.1 01/17/2023   HCT 42.0 01/17/2023   MCV 94 01/17/2023   PLT 266 01/17/2023   Lab Results  Component Value Date   NA 139 01/17/2023   K 4.5 01/17/2023   CO2 24 01/17/2023   GLUCOSE 90 01/17/2023   BUN 12 01/17/2023   CREATININE 0.73 01/17/2023   BILITOT 1.1 01/17/2023   ALKPHOS 70 01/17/2023   AST 17 01/17/2023   ALT 18 01/17/2023   PROT 6.5 01/17/2023   ALBUMIN 4.2 01/17/2023   CALCIUM 9.1 01/17/2023   ANIONGAP 5 09/25/2015   EGFR 106 01/17/2023   Lab Results  Component Value Date   CHOL 204 (H) 01/17/2023   Lab Results  Component Value Date   HDL 50 01/17/2023   Lab Results  Component Value Date   LDLCALC 116 (H)  01/17/2023   Lab Results  Component Value Date   TRIG 218 (H) 01/17/2023   Lab Results  Component Value  Date   CHOLHDL 4.1 01/17/2023   Lab Results  Component Value Date   HGBA1C 5.2 01/17/2023      Assessment & Plan:  Plantar fasciitis, bilateral Assessment & Plan: Encouraged rest and activity modification. Advised ice therapy for 15-20 minutes, up to 4 times daily. Recommended wearing sturdy, supportive shoes with good arch and heel support. Encouraged daily exercises to relieve heel pain. Advised use of over-the-counter NSAIDs to reduce pain and inflammation. Will consider referral to orthopedic surgery if no improvement is noted after 6-8 weeks.    IFG (impaired fasting glucose) -     Hemoglobin A1c  Vitamin D deficiency -     VITAMIN D 25 Hydroxy (Vit-D Deficiency, Fractures)  TSH (thyroid-stimulating hormone deficiency) -     TSH + free T4  Other hyperlipidemia -     Lipid panel -     CMP14+EGFR -     CBC with Differential/Platelet  Note: This chart has been completed using Engineer, civil (consulting) software, and while attempts have been made to ensure accuracy, certain words and phrases may not be transcribed as intended.    Follow-up: Return in about 4 months (around 10/06/2023).   Gilmore Laroche, FNP

## 2023-10-17 ENCOUNTER — Ambulatory Visit: Payer: Self-pay | Admitting: Family Medicine

## 2024-01-01 ENCOUNTER — Ambulatory Visit: Admitting: Family Medicine

## 2024-01-17 LAB — CBC WITH DIFFERENTIAL/PLATELET
Basophils Absolute: 0 x10E3/uL (ref 0.0–0.2)
Basos: 0 %
EOS (ABSOLUTE): 0 x10E3/uL (ref 0.0–0.4)
Eos: 1 %
Hematocrit: 43.2 % (ref 34.0–46.6)
Hemoglobin: 14.2 g/dL (ref 11.1–15.9)
Immature Grans (Abs): 0 x10E3/uL (ref 0.0–0.1)
Immature Granulocytes: 0 %
Lymphocytes Absolute: 1.7 x10E3/uL (ref 0.7–3.1)
Lymphs: 19 %
MCH: 31.4 pg (ref 26.6–33.0)
MCHC: 32.9 g/dL (ref 31.5–35.7)
MCV: 96 fL (ref 79–97)
Monocytes Absolute: 0.4 x10E3/uL (ref 0.1–0.9)
Monocytes: 5 %
Neutrophils Absolute: 6.5 x10E3/uL (ref 1.4–7.0)
Neutrophils: 75 %
Platelets: 258 x10E3/uL (ref 150–450)
RBC: 4.52 x10E6/uL (ref 3.77–5.28)
RDW: 11.9 % (ref 11.7–15.4)
WBC: 8.7 x10E3/uL (ref 3.4–10.8)

## 2024-01-17 LAB — CMP14+EGFR
ALT: 14 IU/L (ref 0–32)
AST: 16 IU/L (ref 0–40)
Albumin: 4.4 g/dL (ref 3.9–4.9)
Alkaline Phosphatase: 59 IU/L (ref 41–116)
BUN/Creatinine Ratio: 16 (ref 9–23)
BUN: 12 mg/dL (ref 6–24)
Bilirubin Total: 1.1 mg/dL (ref 0.0–1.2)
CO2: 23 mmol/L (ref 20–29)
Calcium: 9.1 mg/dL (ref 8.7–10.2)
Chloride: 102 mmol/L (ref 96–106)
Creatinine, Ser: 0.76 mg/dL (ref 0.57–1.00)
Globulin, Total: 2.4 g/dL (ref 1.5–4.5)
Glucose: 92 mg/dL (ref 70–99)
Potassium: 4.4 mmol/L (ref 3.5–5.2)
Sodium: 139 mmol/L (ref 134–144)
Total Protein: 6.8 g/dL (ref 6.0–8.5)
eGFR: 100 mL/min/1.73 (ref >59)

## 2024-01-17 LAB — LIPID PANEL
Chol/HDL Ratio: 3.1 ratio (ref 0.0–4.4)
Cholesterol, Total: 174 mg/dL (ref 100–199)
HDL: 57 mg/dL (ref >39)
LDL Chol Calc (NIH): 100 mg/dL — ABNORMAL HIGH (ref 0–99)
Triglycerides: 95 mg/dL (ref 0–149)
VLDL Cholesterol Cal: 17 mg/dL (ref 5–40)

## 2024-01-17 LAB — HEMOGLOBIN A1C
Est. average glucose Bld gHb Est-mCnc: 91 mg/dL
Hgb A1c MFr Bld: 4.8 % (ref 4.8–5.6)

## 2024-01-17 LAB — TSH+FREE T4
Free T4: 1.09 ng/dL (ref 0.82–1.77)
TSH: 1.12 u[IU]/mL (ref 0.450–4.500)

## 2024-01-17 LAB — VITAMIN D 25 HYDROXY (VIT D DEFICIENCY, FRACTURES): Vit D, 25-Hydroxy: 36 ng/mL (ref 30.0–100.0)

## 2024-01-19 ENCOUNTER — Ambulatory Visit: Payer: Self-pay | Admitting: Family Medicine

## 2024-01-19 NOTE — Progress Notes (Signed)
 Please inform the patient that her cholesterol levels have improved--great job, and keep up the good work! I recommend continuing to decrease her intake of greasy, fatty, and starchy foods and increasing her physical activity. All other laboratory results are stable.

## 2024-01-27 ENCOUNTER — Other Ambulatory Visit (HOSPITAL_COMMUNITY): Payer: Self-pay | Admitting: Adult Health

## 2024-01-27 DIAGNOSIS — Z1231 Encounter for screening mammogram for malignant neoplasm of breast: Secondary | ICD-10-CM

## 2024-02-10 ENCOUNTER — Encounter: Payer: Self-pay | Admitting: Adult Health

## 2024-02-10 ENCOUNTER — Other Ambulatory Visit (HOSPITAL_COMMUNITY)
Admission: RE | Admit: 2024-02-10 | Discharge: 2024-02-10 | Disposition: A | Source: Ambulatory Visit | Attending: Obstetrics & Gynecology | Admitting: Obstetrics & Gynecology

## 2024-02-10 ENCOUNTER — Ambulatory Visit: Admitting: Adult Health

## 2024-02-10 VITALS — BP 116/78 | HR 64 | Ht 67.0 in | Wt 207.5 lb

## 2024-02-10 DIAGNOSIS — N930 Postcoital and contact bleeding: Secondary | ICD-10-CM | POA: Insufficient documentation

## 2024-02-10 DIAGNOSIS — Z3202 Encounter for pregnancy test, result negative: Secondary | ICD-10-CM | POA: Insufficient documentation

## 2024-02-10 DIAGNOSIS — Z01419 Encounter for gynecological examination (general) (routine) without abnormal findings: Secondary | ICD-10-CM

## 2024-02-10 LAB — POCT URINE PREGNANCY: Preg Test, Ur: NEGATIVE

## 2024-02-10 MED ORDER — METRONIDAZOLE 0.75 % VA GEL: 1.0000 | Freq: Every day | VAGINAL | 0 refills | Status: AC

## 2024-02-10 NOTE — Progress Notes (Addendum)
 Patient ID: Courtney Andersen, female   DOB: 09-Dec-1980, 43 y.o.   MRN: [id number] History of Present Illness: Courtney Andersen is a 43 year old white female, married, G0P0, in of a well woman gyn exam and pap. She is having bleeding after sex, she is not having sex often, she had LEEP in office about 2013 or so.  She is still working at [employer].  PCP is Meade Gerlach FNP   Current Medications, Allergies, Past Medical History, Past Surgical History, Family History and Social History were reviewed in [employer] record.     Review of Systems: Patient denies any headaches, hearing loss, fatigue, blurred vision, shortness of breath, chest pain, abdominal pain, problems with bowel movements,(has constipation, takes metamucil, but not helped a lot, try apples with peelings and grapes) urination, or intercourse(not having often but bleeds after). No joint pain or mood swings.  Can't wear tampons feels like body wants to push out   Physical Exam:BP 116/78 (BP Location: Right Arm, Patient Position: Sitting, Cuff Size: Large)   Pulse 64   Ht 5' 7 (1.702 m)   Wt 207 lb 8 oz (94.1 kg)   LMP 01/19/2024 (Approximate)   BMI 32.50 kg/m   UPT is negative  General:  Well developed, well nourished, no acute distress Skin:  Warm and dry Neck:  Midline trachea, normal thyroid, good ROM, no lymphadenopathy Lungs; Clear to auscultation bilaterally Breast:  No dominant palpable mass, retraction, or nipple discharge Cardiovascular: Regular rate and rhythm Abdomen:  Soft, non tender, no hepatosplenomegaly Pelvic:  External genitalia is normal in appearance, no lesions.  The vagina is normal in appearance. Urethra has no lesions or masses. The cervix is friable with EC brush, pap with HR HPV genotyping performed.  Uterus is felt to be normal size, shape, and contour.  No adnexal masses or tenderness noted.Bladder is non tender, no masses felt. Rectal:deferred Extremities/musculoskeletal:  No  swelling or varicosities noted, no clubbing or cyanosis Psych:  No mood changes, alert and cooperative,seems happy AA is 2 Fall risk is low    02/10/2024    3:23 PM 06/06/2023    9:05 AM 01/08/2023    2:54 PM  Depression screen PHQ 2/9  Decreased Interest 0 0 0  Down, Depressed, Hopeless 0 0 0  PHQ - 2 Score 0 0 0  Altered sleeping 0  0  Tired, decreased energy 0  0  Change in appetite 0  0  Feeling bad or failure about yourself  0  0  Trouble concentrating 0  0  Moving slowly or fidgety/restless 0  0  Suicidal thoughts 0  0  PHQ-9 Score 0  0      Data saved with a previous flowsheet row definition       02/10/2024    3:24 PM 01/08/2023    2:54 PM 12/31/2022    9:45 AM 09/26/2021    2:55 PM  GAD 7 : Generalized Anxiety Score  Nervous, Anxious, on Edge 0 0 0 1  Control/stop worrying 0 0 0 0  Worry too much - different things 0 0 0 0  Trouble relaxing 0 0 0 0  Restless 0 0 0 0  Easily annoyed or irritable 0 0 0 1  Afraid - awful might happen 0 0 0 0  Total GAD 7 Score 0 0 0 2  Anxiety Difficulty   Not difficult at all       Upstream - 02/10/24 1531  Pregnancy Intention Screening   Does the patient want to become pregnant in the next year? Ok Either Way    Does the patient's partner want to become pregnant in the next year? Ok Either Way    Would the patient like to discuss contraceptive options today? No      Contraception Wrap Up   Current Method No Method - Other Reason    End Method No Method - Other Reason          Examination chaperoned by Clarita Salt LPN   Impression and Plan: 1. Negative pregnancy test - POCT urine pregnancy  2. Encounter for gynecological examination with Papanicolaou smear of cervix (Primary) Pap sent Physical in 1 year Labs with PCP Mammogram was negative 02/14/23 and scheduled for 02/18/24 Stay active  - Cytology - PAP( [place])  3. Postcoital and contact bleeding Will rx metrogel   Meds ordered this encounter   Medications   metroNIDAZOLE  (METROGEL ) 0.75 % vaginal gel    Sig: Place 1 Applicatorful vaginally at bedtime.    Dispense:  70 g    Refill:  0    Supervising Provider:   JAYNE MINDER H [[id number]]

## 2024-02-11 ENCOUNTER — Other Ambulatory Visit: Payer: Self-pay | Admitting: Medical Genetics

## 2024-02-12 ENCOUNTER — Ambulatory Visit: Payer: Self-pay | Admitting: Adult Health

## 2024-02-12 LAB — CYTOLOGY - PAP
Comment: NEGATIVE
Diagnosis: NEGATIVE
High risk HPV: NEGATIVE

## 2024-02-18 ENCOUNTER — Inpatient Hospital Stay (HOSPITAL_COMMUNITY): Admission: RE | Admit: 2024-02-18 | Discharge: 2024-02-18 | Attending: Adult Health | Admitting: Adult Health

## 2024-02-18 ENCOUNTER — Encounter (HOSPITAL_COMMUNITY): Payer: Self-pay

## 2024-02-18 ENCOUNTER — Inpatient Hospital Stay (HOSPITAL_COMMUNITY)
Admission: RE | Admit: 2024-02-18 | Discharge: 2024-02-18 | Payer: Self-pay | Attending: Adult Health | Admitting: Adult Health

## 2024-02-18 DIAGNOSIS — Z1231 Encounter for screening mammogram for malignant neoplasm of breast: Secondary | ICD-10-CM | POA: Insufficient documentation

## 2024-02-25 ENCOUNTER — Ambulatory Visit: Payer: Self-pay | Admitting: Adult Health

## 2024-02-27 LAB — GENECONNECT MOLECULAR SCREEN: Genetic Analysis Overall Interpretation: NEGATIVE

## 2024-04-16 ENCOUNTER — Ambulatory Visit: Admitting: Family Medicine
